# Patient Record
Sex: Male | Born: 1965 | Race: White | Hispanic: No | Marital: Married | Smoking: Never smoker
Health system: Southern US, Community
[De-identification: ages and names within clinical notes are randomized; demographics above are authoritative.]

## PROBLEM LIST (undated history)

## (undated) DIAGNOSIS — J189 Pneumonia, unspecified organism: Secondary | ICD-10-CM

## (undated) DIAGNOSIS — M109 Gout, unspecified: Secondary | ICD-10-CM

## (undated) DIAGNOSIS — E291 Testicular hypofunction: Secondary | ICD-10-CM

## (undated) DIAGNOSIS — E7800 Pure hypercholesterolemia, unspecified: Secondary | ICD-10-CM

## (undated) DIAGNOSIS — F32A Depression, unspecified: Secondary | ICD-10-CM

## (undated) DIAGNOSIS — R0602 Shortness of breath: Secondary | ICD-10-CM

## (undated) DIAGNOSIS — R002 Palpitations: Secondary | ICD-10-CM

## (undated) DIAGNOSIS — F329 Major depressive disorder, single episode, unspecified: Secondary | ICD-10-CM

## (undated) DIAGNOSIS — K219 Gastro-esophageal reflux disease without esophagitis: Secondary | ICD-10-CM

## (undated) DIAGNOSIS — I2699 Other pulmonary embolism without acute cor pulmonale: Secondary | ICD-10-CM

## (undated) DIAGNOSIS — E559 Vitamin D deficiency, unspecified: Secondary | ICD-10-CM

## (undated) HISTORY — DX: Depression, unspecified: F32.A

## (undated) HISTORY — DX: Gout, unspecified: M10.9

## (undated) HISTORY — DX: Palpitations: R00.2

## (undated) HISTORY — DX: Pure hypercholesterolemia, unspecified: E78.00

## (undated) HISTORY — PX: TONSILLECTOMY AND ADENOIDECTOMY: SUR1326

## (undated) HISTORY — DX: Gastro-esophageal reflux disease without esophagitis: K21.9

## (undated) HISTORY — DX: Testicular hypofunction: E29.1

## (undated) HISTORY — DX: Vitamin D deficiency, unspecified: E55.9

## (undated) HISTORY — DX: Pneumonia, unspecified organism: J18.9

## (undated) HISTORY — PX: EYE SURGERY: SHX253

## (undated) HISTORY — DX: Other pulmonary embolism without acute cor pulmonale: I26.99

## (undated) HISTORY — DX: Major depressive disorder, single episode, unspecified: F32.9

## (undated) HISTORY — PX: VASECTOMY: SHX75

## (undated) HISTORY — DX: Shortness of breath: R06.02

---

## 1998-04-04 ENCOUNTER — Other Ambulatory Visit: Admission: RE | Admit: 1998-04-04 | Discharge: 1998-04-04 | Payer: Self-pay | Admitting: *Deleted

## 2003-06-03 ENCOUNTER — Ambulatory Visit (HOSPITAL_COMMUNITY): Admission: RE | Admit: 2003-06-03 | Discharge: 2003-06-03 | Payer: Self-pay | Admitting: Internal Medicine

## 2003-06-03 ENCOUNTER — Encounter: Payer: Self-pay | Admitting: Internal Medicine

## 2010-03-24 ENCOUNTER — Encounter: Payer: Self-pay | Admitting: Nurse Practitioner

## 2011-02-24 ENCOUNTER — Ambulatory Visit
Admission: RE | Admit: 2011-02-24 | Discharge: 2011-02-24 | Disposition: A | Discharge status: Home / Self Care | Payer: BC Managed Care – PPO | Source: Ambulatory Visit | Attending: Internal Medicine | Admitting: Internal Medicine

## 2011-02-24 ENCOUNTER — Emergency Department (HOSPITAL_COMMUNITY)
Admission: EM | Admit: 2011-02-24 | Discharge: 2011-02-25 | Disposition: A | Discharge status: Home / Self Care | Payer: BC Managed Care – PPO | Attending: Emergency Medicine | Admitting: Emergency Medicine

## 2011-02-24 ENCOUNTER — Other Ambulatory Visit: Payer: Self-pay | Admitting: Internal Medicine

## 2011-02-24 DIAGNOSIS — J189 Pneumonia, unspecified organism: Secondary | ICD-10-CM | POA: Insufficient documentation

## 2011-02-24 DIAGNOSIS — M549 Dorsalgia, unspecified: Secondary | ICD-10-CM | POA: Insufficient documentation

## 2011-02-24 DIAGNOSIS — R0781 Pleurodynia: Secondary | ICD-10-CM

## 2011-02-24 DIAGNOSIS — R0602 Shortness of breath: Secondary | ICD-10-CM | POA: Insufficient documentation

## 2011-02-24 DIAGNOSIS — R509 Fever, unspecified: Secondary | ICD-10-CM

## 2011-02-24 DIAGNOSIS — R109 Unspecified abdominal pain: Secondary | ICD-10-CM

## 2011-02-24 DIAGNOSIS — IMO0001 Reserved for inherently not codable concepts without codable children: Secondary | ICD-10-CM

## 2011-02-24 DIAGNOSIS — R079 Chest pain, unspecified: Secondary | ICD-10-CM | POA: Insufficient documentation

## 2011-02-24 DIAGNOSIS — Z79899 Other long term (current) drug therapy: Secondary | ICD-10-CM | POA: Insufficient documentation

## 2011-02-24 DIAGNOSIS — F3289 Other specified depressive episodes: Secondary | ICD-10-CM | POA: Insufficient documentation

## 2011-02-24 DIAGNOSIS — E785 Hyperlipidemia, unspecified: Secondary | ICD-10-CM | POA: Insufficient documentation

## 2011-02-24 DIAGNOSIS — K219 Gastro-esophageal reflux disease without esophagitis: Secondary | ICD-10-CM | POA: Insufficient documentation

## 2011-02-24 DIAGNOSIS — F329 Major depressive disorder, single episode, unspecified: Secondary | ICD-10-CM | POA: Insufficient documentation

## 2011-02-24 MED ORDER — IOHEXOL 300 MG/ML  SOLN
100.0000 mL | Freq: Once | INTRAMUSCULAR | Status: AC | PRN
  Administered 2011-02-24: 100 mL via INTRAVENOUS

## 2011-02-25 ENCOUNTER — Emergency Department (HOSPITAL_COMMUNITY): Payer: BC Managed Care – PPO

## 2011-02-25 LAB — DIFFERENTIAL
Basophils Absolute: 0 10*3/uL (ref 0.0–0.1)
Eosinophils Absolute: 0 10*3/uL (ref 0.0–0.7)
Eosinophils Relative: 0 % (ref 0–5)
Lymphocytes Relative: 12 % (ref 12–46)
Lymphs Abs: 1.5 10*3/uL (ref 0.7–4.0)
Monocytes Absolute: 1.4 10*3/uL — ABNORMAL HIGH (ref 0.1–1.0)

## 2011-02-25 LAB — URINALYSIS, ROUTINE W REFLEX MICROSCOPIC
Bilirubin Urine: NEGATIVE
Ketones, ur: NEGATIVE mg/dL
Nitrite: NEGATIVE
Protein, ur: NEGATIVE mg/dL
pH: 6 (ref 5.0–8.0)

## 2011-02-25 LAB — BASIC METABOLIC PANEL
BUN: 10 mg/dL (ref 6–23)
Calcium: 8.7 mg/dL (ref 8.4–10.5)
GFR calc non Af Amer: >60 mL/min (ref >60)
Glucose, Bld: 140 mg/dL — ABNORMAL HIGH (ref 70–99)

## 2011-02-25 LAB — CBC
HCT: 39.2 % (ref 39.0–52.0)
MCHC: 35.5 g/dL (ref 30.0–36.0)
MCV: 86.5 fL (ref 78.0–100.0)
Platelets: 244 10*3/uL (ref 150–400)
RDW: 13.8 % (ref 11.5–15.5)

## 2011-02-26 ENCOUNTER — Encounter: Payer: Self-pay | Admitting: Internal Medicine

## 2011-02-26 ENCOUNTER — Other Ambulatory Visit (INDEPENDENT_AMBULATORY_CARE_PROVIDER_SITE_OTHER): Payer: BC Managed Care – PPO

## 2011-02-26 ENCOUNTER — Ambulatory Visit (INDEPENDENT_AMBULATORY_CARE_PROVIDER_SITE_OTHER): Payer: BC Managed Care – PPO | Admitting: Internal Medicine

## 2011-02-26 ENCOUNTER — Ambulatory Visit (INDEPENDENT_AMBULATORY_CARE_PROVIDER_SITE_OTHER)
Admission: RE | Admit: 2011-02-26 | Discharge: 2011-02-26 | Disposition: A | Discharge status: Home / Self Care | Payer: BC Managed Care – PPO | Source: Ambulatory Visit

## 2011-02-26 ENCOUNTER — Inpatient Hospital Stay (HOSPITAL_COMMUNITY)
Admission: EM | Admit: 2011-02-26 | Discharge: 2011-03-02 | DRG: 078 | Disposition: A | Discharge status: Home / Self Care | Payer: BC Managed Care – PPO | Attending: Emergency Medicine | Admitting: Emergency Medicine

## 2011-02-26 ENCOUNTER — Inpatient Hospital Stay (HOSPITAL_COMMUNITY): Payer: BC Managed Care – PPO

## 2011-02-26 VITALS — BP 120/82 | HR 96 | Temp 98.6°F | Ht 72.0 in | Wt 254.4 lb

## 2011-02-26 DIAGNOSIS — R071 Chest pain on breathing: Secondary | ICD-10-CM

## 2011-02-26 DIAGNOSIS — R0781 Pleurodynia: Secondary | ICD-10-CM

## 2011-02-26 DIAGNOSIS — E7800 Pure hypercholesterolemia, unspecified: Secondary | ICD-10-CM | POA: Diagnosis present

## 2011-02-26 DIAGNOSIS — F3289 Other specified depressive episodes: Secondary | ICD-10-CM | POA: Diagnosis present

## 2011-02-26 DIAGNOSIS — F329 Major depressive disorder, single episode, unspecified: Secondary | ICD-10-CM | POA: Diagnosis present

## 2011-02-26 DIAGNOSIS — K219 Gastro-esophageal reflux disease without esophagitis: Secondary | ICD-10-CM | POA: Diagnosis present

## 2011-02-26 DIAGNOSIS — I2699 Other pulmonary embolism without acute cor pulmonale: Principal | ICD-10-CM | POA: Diagnosis present

## 2011-02-26 LAB — CBC WITH DIFFERENTIAL/PLATELET
Basophils Relative: 0.6 % (ref 0.0–3.0)
Eosinophils Relative: 0.6 % (ref 0.0–5.0)
HCT: 41.2 % (ref 39.0–52.0)
Hemoglobin: 14.4 g/dL (ref 13.0–17.0)
Lymphs Abs: 1.7 10*3/uL (ref 0.7–4.0)
Monocytes Relative: 7.9 % (ref 3.0–12.0)
Neutro Abs: 9.4 10*3/uL — ABNORMAL HIGH (ref 1.4–7.7)
RBC: 4.51 Mil/uL (ref 4.22–5.81)
WBC: 12.2 10*3/uL — ABNORMAL HIGH (ref 4.5–10.5)

## 2011-02-26 LAB — BASIC METABOLIC PANEL
CO2: 28 mEq/L (ref 19–32)
Chloride: 102 mEq/L (ref 96–112)
Creatinine, Ser: 1.2 mg/dL (ref 0.4–1.5)

## 2011-02-26 LAB — SEDIMENTATION RATE: Sed Rate: 73 mm/hr — ABNORMAL HIGH (ref 0–22)

## 2011-02-26 LAB — CARDIAC PANEL(CRET KIN+CKTOT+MB+TROPI): CK, MB: 1.4 ng/mL (ref 0.3–4.0)

## 2011-02-26 LAB — URINE CULTURE

## 2011-02-26 MED ORDER — IOHEXOL 300 MG/ML  SOLN
100.0000 mL | Freq: Once | INTRAMUSCULAR | Status: AC | PRN
  Administered 2011-02-26: 100 mL via INTRAVENOUS

## 2011-02-26 NOTE — Progress Notes (Signed)
Subjective:     Patient ID: [name], male   DOB: October 06, 1965, 45 y.o.   MRN: [id number]  HPI  21 yowm never smoker and no previous respiratory dz referred to Pulmonary clinic by Dr Elisabeth Most 02/26/2011 for eval of cp   02/26/2011  Initial pulmonary office eval cc sudden onset back pain  p travel by car  to [facility] from [place] completed May 19th and onset pain June 4th felt like a kidney stone but hurt to lie on side intially R flank but began radiating up to R axilla rx with nsaids then 6/6 started levaquin mild chills,  No real cough.    Pt denies any significant sore throat, dysphagia, itching, sneezing,  nasal congestion or excess/ purulent secretions,  fever,  Shaking chills, sweats, unintended wt loss,  exertional cp, hempoptysis, orthopnea pnd or leg swelling.    Also denies any obvious fluctuation of symptoms with weather or environmental changes or other aggravating or alleviating factors.      PMHX  No h/o leg trauma No h/o dental problems No h/o unusual exposures  Review of Systems  Constitutional: Positive for appetite change. Negative for fever, chills, activity change and unexpected weight change.  HENT: Negative for congestion, sore throat, rhinorrhea, sneezing, trouble swallowing, dental problem, voice change and postnasal drip.   Eyes: Negative for visual disturbance.  Respiratory: Positive for shortness of breath. Negative for cough and choking.   Cardiovascular: Positive for chest pain. Negative for leg swelling.  Gastrointestinal: Negative for nausea, vomiting and abdominal pain.  Genitourinary: Negative for difficulty urinating.  Musculoskeletal: Negative for arthralgias.  Skin: Negative for rash.  Psychiatric/Behavioral: Negative for behavioral problems and confusion.       Objective:   Physical Exam    pleasant mod obese wm nad HEENT: nl dentition, turbinates, and orophanx. Nl external ear canals without cough reflex   NECK :  without  JVD/Nodes/TM/ nl carotid upstrokes bilaterally   LUNGS: no acc muscle use, clear to A and P bilaterally without cough on insp or exp maneuvers   CV:  RRR  no s3 or murmur or increase in P2, no edema   ABD:  soft and nontender with nl excursion in the supine position. No bruits or organomegaly, bowel sounds nl  MS:  warm without deformities, calf tenderness, cyanosis or clubbing  SKIN: warm and dry without lesions    NEURO:  alert, approp, no deficits   Assessment:         Plan:

## 2011-02-26 NOTE — Patient Instructions (Signed)
Please see patient coordinator before you leave today  to schedule CT Chest PE protocol  Increase levaquin to 750 mg daily until gone and take vicodin for pain if Scan  Is neg for PE

## 2011-02-26 NOTE — Assessment & Plan Note (Signed)
PE until proven otherwise > CT angiogram and admit to [facility] if Positive  Add:  POS BILAT PE > Admitted by [facility] service

## 2011-02-27 DIAGNOSIS — J9819 Other pulmonary collapse: Secondary | ICD-10-CM

## 2011-02-27 DIAGNOSIS — F329 Major depressive disorder, single episode, unspecified: Secondary | ICD-10-CM

## 2011-02-27 DIAGNOSIS — I2699 Other pulmonary embolism without acute cor pulmonale: Secondary | ICD-10-CM

## 2011-02-27 DIAGNOSIS — F3289 Other specified depressive episodes: Secondary | ICD-10-CM

## 2011-02-27 DIAGNOSIS — E782 Mixed hyperlipidemia: Secondary | ICD-10-CM

## 2011-02-27 LAB — COMPREHENSIVE METABOLIC PANEL
AST: 82 U/L — ABNORMAL HIGH (ref 0–37)
Albumin: 3.1 g/dL — ABNORMAL LOW (ref 3.5–5.2)
Calcium: 8.7 mg/dL (ref 8.4–10.5)
Chloride: 99 mEq/L (ref 96–112)
Creatinine, Ser: 0.99 mg/dL (ref 0.4–1.5)
Total Protein: 7 g/dL (ref 6.0–8.3)

## 2011-02-27 LAB — CBC
HCT: 37.5 % — ABNORMAL LOW (ref 39.0–52.0)
Hemoglobin: 12.8 g/dL — ABNORMAL LOW (ref 13.0–17.0)
MCV: 87.6 fL (ref 78.0–100.0)
Platelets: 270 10*3/uL (ref 150–400)
RBC: 4.21 MIL/uL — ABNORMAL LOW (ref 4.22–5.81)
RBC: 4.24 MIL/uL (ref 4.22–5.81)
WBC: 8.4 10*3/uL (ref 4.0–10.5)
WBC: 8.8 10*3/uL (ref 4.0–10.5)

## 2011-02-27 LAB — PROTIME-INR
INR: 1.09 (ref 0.00–1.49)
INR: 1.14 (ref 0.00–1.49)
Prothrombin Time: 14.8 seconds (ref 11.6–15.2)

## 2011-02-27 LAB — BASIC METABOLIC PANEL
BUN: 12 mg/dL (ref 6–23)
CO2: 24 mEq/L (ref 19–32)
Calcium: 8.7 mg/dL (ref 8.4–10.5)
Creatinine, Ser: 0.95 mg/dL (ref 0.4–1.5)
GFR calc non Af Amer: >60 mL/min (ref >60)
Glucose, Bld: 128 mg/dL — ABNORMAL HIGH (ref 70–99)
Sodium: 138 mEq/L (ref 135–145)

## 2011-02-27 LAB — LIPASE, BLOOD: Lipase: 26 U/L (ref 11–59)

## 2011-02-27 LAB — CARDIAC PANEL(CRET KIN+CKTOT+MB+TROPI)
CK, MB: 0.9 ng/mL (ref 0.3–4.0)
CK, MB: 0.8 ng/mL (ref 0.3–4.0)
Relative Index: 0.8 (ref 0.0–2.5)
Total CK: 117 U/L (ref 7–232)
Total CK: 89 U/L (ref 7–232)
Troponin I: <0.3 ng/mL (ref <0.30)

## 2011-02-27 LAB — HEPARIN LEVEL (UNFRACTIONATED)
Heparin Unfractionated: 0.24 IU/mL — ABNORMAL LOW (ref 0.30–0.70)
Heparin Unfractionated: 0.28 IU/mL — ABNORMAL LOW (ref 0.30–0.70)

## 2011-02-27 LAB — PHOSPHORUS: Phosphorus: 3.4 mg/dL (ref 2.3–4.6)

## 2011-02-27 LAB — APTT
aPTT: 57 seconds — ABNORMAL HIGH (ref 24–37)
aPTT: 64 seconds — ABNORMAL HIGH (ref 24–37)

## 2011-02-28 LAB — CBC
HCT: 36.4 % — ABNORMAL LOW (ref 39.0–52.0)
Platelets: 290 10*3/uL (ref 150–400)
RBC: 4.12 MIL/uL — ABNORMAL LOW (ref 4.22–5.81)
RDW: 13.6 % (ref 11.5–15.5)
WBC: 7.9 10*3/uL (ref 4.0–10.5)

## 2011-02-28 LAB — PROTIME-INR: INR: 1.14 (ref 0.00–1.49)

## 2011-03-01 LAB — CBC
HCT: 37.1 % — ABNORMAL LOW (ref 39.0–52.0)
Hemoglobin: 12.7 g/dL — ABNORMAL LOW (ref 13.0–17.0)
RBC: 4.25 MIL/uL (ref 4.22–5.81)

## 2011-03-01 LAB — HEPARIN LEVEL (UNFRACTIONATED): Heparin Unfractionated: 0.28 IU/mL — ABNORMAL LOW (ref 0.30–0.70)

## 2011-03-01 LAB — PROTIME-INR: Prothrombin Time: 14 seconds (ref 11.6–15.2)

## 2011-03-02 LAB — LUPUS ANTICOAGULANT PANEL
Lupus Anticoagulant: NOT DETECTED
dRVVT Incubated 1:1 Mix: 38.5 secs (ref 36.2–44.3)

## 2011-03-02 LAB — PROTIME-INR
INR: 1.11 (ref 0.00–1.49)
Prothrombin Time: 14.5 seconds (ref 11.6–15.2)

## 2011-03-02 LAB — PROTEIN C ACTIVITY: Protein C Activity: 93 % (ref 75–133)

## 2011-03-02 LAB — HOMOCYSTEINE: Homocysteine: 10.4 umol/L (ref 4.0–15.4)

## 2011-03-02 LAB — PROTEIN S ACTIVITY: Protein S Activity: 168 % — ABNORMAL HIGH (ref 69–129)

## 2011-03-02 LAB — CBC
Platelets: 326 10*3/uL (ref 150–400)
RBC: 4.36 MIL/uL (ref 4.22–5.81)
WBC: 7.8 10*3/uL (ref 4.0–10.5)

## 2011-03-02 LAB — PROTEIN C, TOTAL: Protein C, Total: 82 % (ref 72–160)

## 2011-03-03 ENCOUNTER — Telehealth: Payer: Self-pay | Admitting: Internal Medicine

## 2011-03-03 ENCOUNTER — Encounter: Payer: BC Managed Care – PPO | Admitting: *Deleted

## 2011-03-03 LAB — CARDIOLIPIN ANTIBODIES, IGG, IGM, IGA
Anticardiolipin IgA: 7 APL U/mL — ABNORMAL LOW (ref <22)
Anticardiolipin IgG: 8 GPL U/mL — ABNORMAL LOW (ref <23)

## 2011-03-03 LAB — BETA-2-GLYCOPROTEIN I ABS, IGG/M/A: Beta-2-Glycoprotein I IgA: 4 A Units (ref <20)

## 2011-03-03 NOTE — Telephone Encounter (Signed)
Called and spoke with pt.  Pt aware of RB's response.  Pt verbalized understanding and denied any questions and will let coumadin clinic know he is on levaquin.

## 2011-03-03 NOTE — Telephone Encounter (Signed)
The pharmacist is correct. The pt can start the levaquin, but he needs to call to inform his anticoagulation clinic so that they are aware that the new med is onboard. They may want to see him sooner for an INR check

## 2011-03-03 NOTE — Telephone Encounter (Signed)
Called and spoke with pt.  Pt states he went to fill his rx for Levaquin and was told by pharmacist to inform us this med interacts with his coumadin causing the coumadin "to work faster."  Pt states he has been on Levaquin before and didn't have any problems- just wanted to check with a doctor to make sure.  MW out of office this week.  Will forward message to doc of the day, RB, to address.

## 2011-03-04 ENCOUNTER — Ambulatory Visit (INDEPENDENT_AMBULATORY_CARE_PROVIDER_SITE_OTHER): Payer: BC Managed Care – PPO | Admitting: *Deleted

## 2011-03-04 DIAGNOSIS — I2699 Other pulmonary embolism without acute cor pulmonale: Secondary | ICD-10-CM | POA: Insufficient documentation

## 2011-03-04 DIAGNOSIS — I749 Embolism and thrombosis of unspecified artery: Secondary | ICD-10-CM

## 2011-03-04 LAB — POCT INR: INR: 2

## 2011-03-05 LAB — CULTURE, BLOOD (ROUTINE X 2)
Culture  Setup Time: 201206090203
Culture: NO GROWTH

## 2011-03-08 ENCOUNTER — Ambulatory Visit (INDEPENDENT_AMBULATORY_CARE_PROVIDER_SITE_OTHER): Payer: BC Managed Care – PPO | Admitting: *Deleted

## 2011-03-08 DIAGNOSIS — I749 Embolism and thrombosis of unspecified artery: Secondary | ICD-10-CM

## 2011-03-10 ENCOUNTER — Ambulatory Visit (INDEPENDENT_AMBULATORY_CARE_PROVIDER_SITE_OTHER): Payer: BC Managed Care – PPO | Admitting: Internal Medicine

## 2011-03-10 ENCOUNTER — Encounter: Payer: Self-pay | Admitting: Internal Medicine

## 2011-03-10 VITALS — BP 120/80 | HR 83 | Temp 98.3°F | Ht 72.0 in | Wt 261.0 lb

## 2011-03-10 DIAGNOSIS — I749 Embolism and thrombosis of unspecified artery: Secondary | ICD-10-CM

## 2011-03-10 NOTE — Assessment & Plan Note (Signed)
Calorie balance issues addressed

## 2011-03-10 NOTE — Assessment & Plan Note (Signed)
I had an extended discussion with the patient today lasting 15 to 20 minutes of a 25 minute visit on the following issues:   Dx and management reviewed. Only idenfiable risk factor = morbid obesity (see separate assessment)  See instructions for specific recommendations which were reviewed directly with the patient who was given a copy with highlighter outlining the key components.

## 2011-03-10 NOTE — Patient Instructions (Signed)
Stay on coumadin for 6 full months and avoid prolonged inactivity  - no restrictions on activity except prefer low impact aerobics   Your studies do not suggest hypercoagulability but you would benefit from wt loss down to low 200's   Weight control is simply a matter of calorie balance which needs to be tilted in your favor by eating less and exercising more.  To get the most out of exercise, you need to be continuously aware that you are short of breath, but never out of breath, for 30 minutes daily. As you improve, it will actually be easier for you to do the same amount of exercise  in  30 minutes so always push to the level where you are short of breath.  If this does not result in gradual weight reduction then I strongly recommend you see a nutritionist with a food diary x 2 weeks so that we can work out a negative calorie balance which is universally effective in steady weight loss programs.  Think of your calorie balance like you do your bank account where in this case you want the balance to go down so you must take in less calories than you burn up.  It's just that simple:  Hard to do, but easy to understand.  Good luck!   Please schedule a follow up visit in 3 months but call sooner if needed

## 2011-03-10 NOTE — Progress Notes (Signed)
Subjective:     Patient ID: [name], male   DOB: 08-11-1966, 45 y.o.   MRN: [id number]  HPI  44 yowm never smoker and no previous respiratory dz referred to Pulmonary clinic by Dr Elisabeth Most 02/26/2011 for eval of cp   02/26/2011  Initial pulmonary office eval cc sudden onset back pain  p travel by car  to [facility] from [place] completed May 19th and onset pain June 4th felt like a kidney stone but hurt to lie on side intially R flank but began radiating up to R axilla rx with nsaids then 6/6 started levaquin mild chills,  No real cough.  CT chest done and neg PE but not done in PE mode.    rec CT angiogram > pos pe/  Abn venous dopplers on Right but no active dvt > admit [facility] thru 03/02/11  03/10/2011 ov/ Maddax Palinkas  Post hosp  Minimal residual pleuritic discomfort no longer localized and minimal doe.Marland Kitchen Has chronic leg    PMHX  No h/o leg trauma No h/o dental problems No h/o unusual exposures       Objective:   Physical Exam    pleasant mod obese wm nad HEENT: nl dentition, turbinates, and orophanx. Nl external ear canals without cough reflex   NECK :  without JVD/Nodes/TM/ nl carotid upstrokes bilaterally   LUNGS: no acc muscle use, clear to A and P bilaterally without cough on insp or exp maneuvers   CV:  RRR  no s3 or murmur or increase in P2, no edema   ABD:  soft and nontender with nl excursion in the supine position. No bruits or organomegaly, bowel sounds nl  MS:  warm without deformities, calf tenderness, cyanosis or clubbing     Assessment:         Plan:

## 2011-03-11 ENCOUNTER — Encounter: Payer: BC Managed Care – PPO | Admitting: *Deleted

## 2011-03-15 ENCOUNTER — Encounter: Payer: BC Managed Care – PPO | Admitting: *Deleted

## 2011-03-16 ENCOUNTER — Ambulatory Visit (INDEPENDENT_AMBULATORY_CARE_PROVIDER_SITE_OTHER): Payer: BC Managed Care – PPO | Admitting: *Deleted

## 2011-03-16 DIAGNOSIS — I749 Embolism and thrombosis of unspecified artery: Secondary | ICD-10-CM

## 2011-03-16 LAB — POCT INR: INR: 2.3

## 2011-03-21 NOTE — Discharge Summary (Signed)
[name], [name]               ACCOUNT NO.:  [id number]  MEDICAL RECORD NO.:  [id number]  LOCATION:  3729                         FACILITY:  [facility]  PHYSICIAN:  Charlcie Cradle. [name], MD, FCCPDATE OF BIRTH:  07-Aug-1966  DATE OF ADMISSION:  02/26/2011 DATE OF DISCHARGE:  03/02/2011                              DISCHARGE SUMMARY   DISCHARGE DIAGNOSIS:  Acute pulmonary embolism.  HISTORY OF PRESENT ILLNESS:  See history and physical already on the chart.  In summary, this is a 45 year old male with previous history of hypercholesterolemia and depression who came to the hospital after being seen in a Pulmonary Office for 5 days of chest pain and dyspnea.  CT scan of chest was done, found to have bilateral lower lobe pulmonary emboli, was sent, performed CT scan at the emergency department for admission.  He had been on a cruise recently and had driven back from [place] for 12 hours in a sedentary position.  He has a relatively sedentary lifestyle and works from a desk as a Clinical research associate.  The patient is admitted for further inpatient care.  HOSPITAL COURSE:  The patient was thus admitted on to the Pulmonary Service and placed on IV heparin.  Lab data was obtained again showing a white count of 8400, hemoglobin 12.5, platelet count 270,000.  Initial PT/PTT were normal.  The patient did have a hypercoagulable assay performed during this hospitalization.  Protein S level was 147, total and functional 168, both of which are normal.  Protein C was functional was at 93.  Lupus anticoagulant was negative.  Protein C total was 82, which was normal.  Sodium 135, potassium 3.6, chloride 99, CO2 of 26, BUN 11, creatinine 0.9, blood sugar was 122.  Liver function is unremarkable.  Cardiac enzymes were unremarkable.  Cardiolipin antibody was normal.  Blood cultures were obtained with no growth.  Beta-2 glycoprotein antibodies were unremarkable.  Prothrombin gene mutation was negative.  Factor V Leiden  was negative.  HOSPITAL COURSE:  The patient continued to do well and was transitioned to Lovenox; however, his INR still remained normal at 1.1 on the date of discharge on March 02, 2011.  The patient was set up for the Coumadin clinic and was given a dose of 10 mg Coumadin on March 01, 2011, and was to give another 10 mg on March 02, 2011.  The Lovenox dosing was at 120 mg b.i.d. for an additional 7 days.  DISCHARGE MEDICATIONS:  Lovenox 100 mg subcu b.i.d. for additional 7 days.  Coumadin dosing at 10 mg daily to be adjusted further by the Coumadin Clinic for which the patient will be seen in 2 days.  The patient will be maintained on the prior medications as prescribed including the omeprazole 20 mg daily.  Celexa will be maintained at 20 mg daily.  Hydrocodone APAP p.r.n.  He will finish additional 3 days of Levaquin 5 mg daily and Crestor will be continued at 20 mg daily.  Depakote at 250 mg daily.  The patient has a return visit with Dr. Sherene Sires in 1 week's time and will be seen in the Coumadin Clinic in 2 days' time.  The patient is discharged in improved  condition and is ambulatory on room air with no outstanding vital sign abnormalities.     Charlcie Cradle [name], MD, [facility]     PEW/MEDQ  D:  03/13/2011  T:  03/14/2011  Job:  [id number]  cc:   Charlaine Dalton. Sherene Sires, MD, [facility]  Electronically Signed by Shan Levans MD FCCP on 03/21/2011 06:54:40 PM

## 2011-03-22 NOTE — H&P (Signed)
[name], [name] NO.:  [id number]  MEDICAL RECORD NO.:  [id number]  LOCATION:  3729                         FACILITY:  [facility]  PHYSICIAN:  Felipa Evener, MD  DATE OF BIRTH:  1966-04-30  DATE OF ADMISSION:  02/26/2011 DATE OF DISCHARGE:                             HISTORY & PHYSICAL   IDENTIFICATION:  The patient is a 45 year old male with past medical significant for hypercholesteremia and depression, who presented to the hospital after being seen in Pulmonary office for 5 days of chest pains and shortness of breath.  CT scan was done in the office and the patient was found to have pulmonary emboli.  The patient was sent to the emergency department for admission.  Evidently, the patient has been on the cruise that he flew from [place] drove for 12 hours.  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  Approximately two and a half weeks after coming back from the cruise, the patient started exercising on a bike and was noted to develop shortness of breath shortly, thereafter, he did report some swelling in the right leg, but none on the left, with some fever, no chills.  No nausea, no vomiting, no abdominal pain.  Did report bilateral lower lung field chest wall pain with radiating to the back.  The pain is sharp in nature, but not tearing.  He does report some dry cough, but no significant sputum production, no hemoptysis.  No weight gain, no weight loss.  PAST MEDICAL HISTORY:  Significant for depression for which he takes Celexa and Depakote.  GERD for which he takes omeprazole. Hypercholesteremia for which he takes Crestor and was recently started on levofloxacin yesterday for question of pneumonia on chest x-ray and has been taking Percocet for the chest pain as reported above.  ALLERGIES:  NO KNOWN DRUG ALLERGIES.  FAMILY HISTORY:  Positive for hypertension and  heart disease as well as father with melanoma and prostate cancer.  SOCIAL HISTORY:  He is a lifelong nonsmoker, drinks approximately 5 drinks per week.  No drug abuse.  Works as a Clinical research associate and does not exercise regularly.  REVIEW OF SYSTEMS:  Twelve-point review of systems was negative other than mentioned above.  PHYSICAL EXAMINATION:  GENERAL:  This is a well-appearing male, resting comfortably on exam bed, in no acute distress. VITAL SIGNS:  He is afebrile.  Temperature 98.2, heart rate was 97, respiratory rate of 14, blood pressure 107/65 and saturation 97% room air. HEENT:  Normocephalic, atraumatic.  Pupils are equal, round and reactive to light.  Extraocular movements are intact.  Oral and nasal mucosa within normal limits. NECK:  No thyromegaly.  No lymphadenopathy.  No jugular reflux appreciated. HEART:  Regular rate and rhythm.  S1 and S2 normal.  No murmurs, rubs or gallops appreciated. LUNGS:  Clear to auscultation bilaterally. ABDOMEN:  Soft, nontender, nondistended.  Positive bowel sounds. EXTREMITIES:  With 1+ edema on the right, no edema on the left.  No Homans sign and no cord sign appreciated on examination of the lower extremity.  LABORATORY STUDIES:  Were reviewed  significant for chest x-ray reviewed myself that showed a question of right lower lobe infiltrate.  A CT that showed bilateral pulmonary PEs on the lower side with basal atelectasis and small right effusion.  No evidence of infiltrate.  BMP, sodium 137, potassium 4.3, chloride 102, CO2 of 28, glucose of 102, BUN was 10, creatinine 1.2, calcium of 8.9.  His ESR is 73.  CBC:  White blood count was 12.2, hemoglobin 14.4, hematocrit 41.2, platelet count of 303. Urine culture done yesterday was negative.  Lactic acid done yesterday was 1.3.  Urinalysis on June 7th was also negative.  ASSESSMENT/PLAN:  The patient is a 45 year old male with past medical significant for hypercholesteremia and depression,  who presents with a provoked pulmonary embolism, currently the patient is showing no signs of distress, pain seemed to be under control with Percocet.  He is saturating 97% on room air, was not tachycardic, with normal blood pressure, therefore, admit the patient to telemetry, place the patient on heparin drip.  We will continue course of levofloxacin since the patient has had 3 days of that already, for a total of 8 days.  Celexa and Depakote will be continued for depression.  Percocet will be used for p.r.n. for pain and fever.  Protonix 40 mg daily for GI prophylaxis with treatment of the patient's gastroesophageal reflux disease. Heparin drip IV per pharmacy will be started.  We will order bilateral lower extremity Doppler to evaluate the clot burden, which I doubt would be high, 2-D echo to evaluate the ejection fraction and pulmonary pressure as well will be ordered.     Felipa Evener, MD     WJY/MEDQ  D:  02/26/2011  T:  02/27/2011  Job:  [id number]  Electronically Signed by Koren Bound MD on 03/22/2011 04:09:13 PM

## 2011-03-30 ENCOUNTER — Ambulatory Visit (INDEPENDENT_AMBULATORY_CARE_PROVIDER_SITE_OTHER): Payer: BC Managed Care – PPO | Admitting: *Deleted

## 2011-03-30 DIAGNOSIS — I749 Embolism and thrombosis of unspecified artery: Secondary | ICD-10-CM

## 2011-04-27 ENCOUNTER — Ambulatory Visit (INDEPENDENT_AMBULATORY_CARE_PROVIDER_SITE_OTHER): Payer: BC Managed Care – PPO | Admitting: *Deleted

## 2011-04-27 DIAGNOSIS — I749 Embolism and thrombosis of unspecified artery: Secondary | ICD-10-CM

## 2011-05-13 ENCOUNTER — Telehealth: Payer: Self-pay | Admitting: Pharmacist

## 2011-05-13 NOTE — Telephone Encounter (Signed)
Called spoke with pt, pt states for the past 2-3 days his feet have been sore and stiff bilaterally.  Look a little swollen, and wake him up at night, pt states he can only describe it as his feet don't feel right. Pt wanted to know if this could be a side effect of Coumadin, advised pt this is not a typical side effect of Coumadin.  Advised pt to see his primary care MD for evaluation to have assessed. Pt states he will monitor and seek evaluation if persists.

## 2011-05-25 ENCOUNTER — Encounter: Payer: BC Managed Care – PPO | Admitting: *Deleted

## 2011-06-01 ENCOUNTER — Ambulatory Visit (INDEPENDENT_AMBULATORY_CARE_PROVIDER_SITE_OTHER): Payer: BC Managed Care – PPO | Admitting: *Deleted

## 2011-06-01 DIAGNOSIS — I749 Embolism and thrombosis of unspecified artery: Secondary | ICD-10-CM

## 2011-06-15 ENCOUNTER — Ambulatory Visit (INDEPENDENT_AMBULATORY_CARE_PROVIDER_SITE_OTHER): Payer: BC Managed Care – PPO | Admitting: *Deleted

## 2011-06-15 DIAGNOSIS — I749 Embolism and thrombosis of unspecified artery: Secondary | ICD-10-CM

## 2011-06-15 LAB — POCT INR: INR: 2.1

## 2011-06-24 ENCOUNTER — Other Ambulatory Visit (INDEPENDENT_AMBULATORY_CARE_PROVIDER_SITE_OTHER): Payer: BC Managed Care – PPO

## 2011-06-24 ENCOUNTER — Encounter: Payer: Self-pay | Admitting: Internal Medicine

## 2011-06-24 ENCOUNTER — Ambulatory Visit (INDEPENDENT_AMBULATORY_CARE_PROVIDER_SITE_OTHER): Payer: BC Managed Care – PPO | Admitting: Internal Medicine

## 2011-06-24 ENCOUNTER — Ambulatory Visit (INDEPENDENT_AMBULATORY_CARE_PROVIDER_SITE_OTHER)
Admission: RE | Admit: 2011-06-24 | Discharge: 2011-06-24 | Disposition: A | Discharge status: Home / Self Care | Payer: BC Managed Care – PPO | Source: Ambulatory Visit | Attending: Internal Medicine | Admitting: Internal Medicine

## 2011-06-24 VITALS — BP 119/92 | HR 84 | Temp 98.2°F | Ht 72.0 in | Wt 263.2 lb

## 2011-06-24 DIAGNOSIS — I2699 Other pulmonary embolism without acute cor pulmonale: Secondary | ICD-10-CM

## 2011-06-24 DIAGNOSIS — I10 Essential (primary) hypertension: Secondary | ICD-10-CM

## 2011-06-24 LAB — TSH: TSH: 1.46 u[IU]/mL (ref 0.35–5.50)

## 2011-06-24 NOTE — Assessment & Plan Note (Signed)
rec complete 6 months rx then change to ASA 325 mg daily and work on wt

## 2011-06-24 NOTE — Progress Notes (Signed)
Subjective:     Patient ID: [name], male   DOB: [birth year], 45 y.o.   MRN: [id number]  HPI  81 yowm never smoker and no previous respiratory dz referred to Pulmonary clinic by Dr Elisabeth Most 02/26/2011 for eval of cp   02/26/2011  Initial pulmonary office eval cc sudden onset back pain  p travel by car  to [facility] from [place] completed May 19th and onset pain June 4th felt like a kidney stone but hurt to lie on side intially R flank but began radiating up to R axilla rx with nsaids then 6/6 started levaquin mild chills,  No real cough.  CT chest done and neg PE but not done in PE mode.    rec CT angiogram > pos pe/  Abn venous dopplers on Right but no active dvt > admit [facility] thru 03/02/11  03/10/2011 ov/ [name]  Post hosp  Minimal residual pleuritic discomfort no longer localized and minimal doe.Marland Kitchen Has chronic leg  rec Stay on coumadin for 6 full months and avoid prolonged inactivity  - no restrictions on activity except prefer low impact aerobics   Your studies do not suggest hypercoagulability but you would benefit from wt loss down to low 200's   Weight control discussed in context of calorie balance  06/24/2011 f/u ov/[name] cc much better, no sob (though not exercising) no cough or cp, no leg swelling  ROS  At present neg for  any significant sore throat, dysphagia, itching, sneezing,  nasal congestion or excess/ purulent secretions,  fever, chills, sweats, unintended wt loss, pleuritic or exertional cp, hempoptysis, orthopnea pnd.   Also denies presyncope, palpitations, heartburn, abdominal pain, nausea, vomiting, diarrhea  or change in bowel or urinary habits, dysuria,hematuria,  rash, arthralgias, visual complaints, headache, numbness weakness or ataxia.      PMHX  No h/o leg trauma No h/o dental problems No h/o unusual exposures       Objective:   Physical Exam    pleasant mod obese wm nad - note bp 120/90 repeated by me    Wt 254 > Wt 06/24/2011   263    HEENT: nl dentition, turbinates, and orophanx. Nl external ear canals without cough reflex   NECK :  without JVD/Nodes/TM/ nl carotid upstrokes bilaterally   LUNGS: no acc muscle use, clear to A and P bilaterally without cough on insp or exp maneuvers   CV:  RRR  no s3 or murmur or increase in P2, no edema   ABD:  soft and nontender with nl excursion in the supine position. No bruits or organomegaly, bowel sounds nl  MS:  warm without deformities, calf tenderness, cyanosis or clubbing  CXR  06/24/2011 : Comparison: 02/26/2011  Findings: Much improved lung volumes and aeration. No confluent opacities currently. Heart is normal size. No effusions or acute bony abnormality.  IMPRESSION: No active disease.       Assessment:         Plan:

## 2011-06-24 NOTE — Patient Instructions (Addendum)
I recommend you complete 6 total months of coumadin  Be very careful with salt and have your blood pressure rechecked at your next coumadin clinic visit in 2 weeks  Less than 221 is your target weight.  We will call you with your cxr and tsh results  Weight control is simply a matter of calorie balance which needs to be tilted in your favor by eating less and exercising more.  To get the most out of exercise, you need to be continuously aware that you are short of breath, but never out of breath, for 30 minutes daily. As you improve, it will actually be easier for you to do the same amount of exercise  in  30 minutes so always push to the level where you are short of breath.  If this does not result in gradual weight reduction then I strongly recommend you see a nutritionist with a food diary x 2 weeks so that we can work out a negative calorie balance which is universally effective in steady weight loss programs.  Think of your calorie balance like you do your bank account where in this case you want the balance to go down so you must take in less calories than you burn up.  It's just that simple:  Hard to do, but easy to understand.  Good luck!    If you are satisfied with your treatment plan let your doctor know and he/she can either refill your medications or you can return here when your prescription runs out.     If in any way you are not 100% satisfied,  please tell us.  If 100% better, tell your friends!

## 2011-06-24 NOTE — Assessment & Plan Note (Signed)
Cal balance issues addressed

## 2011-06-25 ENCOUNTER — Telehealth: Payer: Self-pay | Admitting: Internal Medicine

## 2011-06-28 NOTE — Progress Notes (Signed)
Quick Note:  I informed pt of MW's findings and recommendations. Pt verbalized understanding    ______ 

## 2011-06-28 NOTE — Telephone Encounter (Signed)
Informed pt of both CXR and lab results per MW. Julaine Hua, CMA

## 2011-06-28 NOTE — Progress Notes (Signed)
Quick Note:  I informed pt of RA's findings and recommendations. Pt verbalized understanding. Dell Hurtubise W. Peng Thorstenson, CMA  ______ 

## 2011-07-06 IMAGING — CT CT ANGIO CHEST
2 of 6 series · 19 of 36 positions shown · IV contrast (Omnipaque 300)
Comparison: 02/24/2011

CLINICAL DATA: Pleuritic right chest pain, shortness of breath,
recent 12 hour car trip, question pulmonary embolism

CT ANGIOGRAPHY CHEST WITH CONTRAST
TECHNIQUE: Multidetector CT imaging of the chest was performed
using the standard protocol during bolus administration of
intravenous contrast.  Multiplanar CT image reconstructions
including MIPs were obtained to evaluate the vascular anatomy.
Contrast:  100 ml Omnipaque 300 IV

[Series 5: thins (id) / (id) · axial · 0.75mm/px · z∈[-217,-7]mm · 18 of 234 slices shown]
[im 12/234  lung]
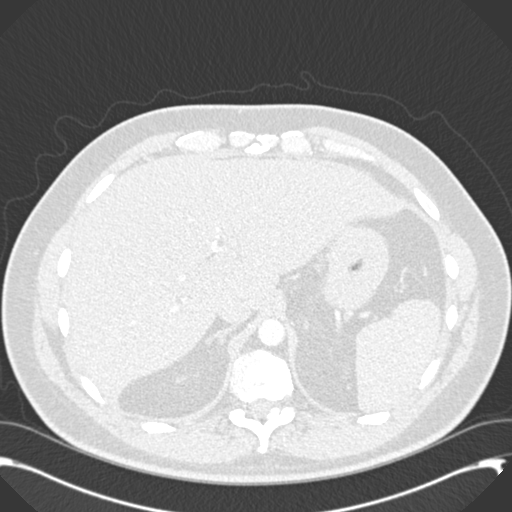
[im 24/234  mediastinal]
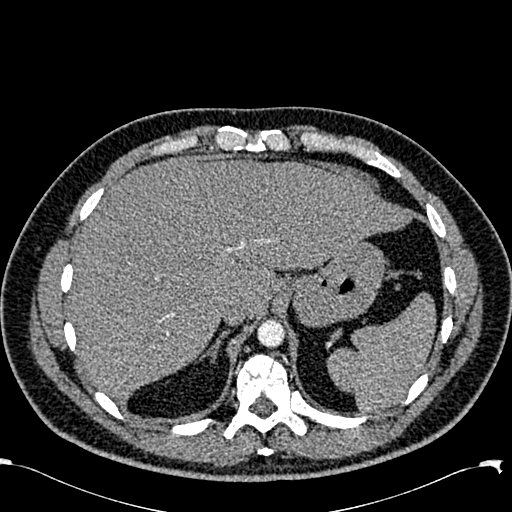
[im 35/234  lung]
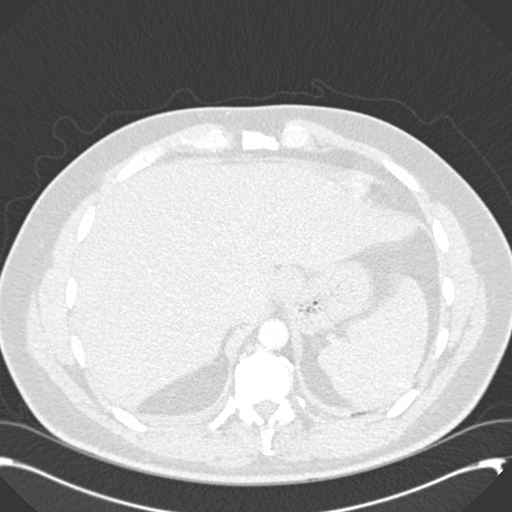
[im 47/234  mediastinal]
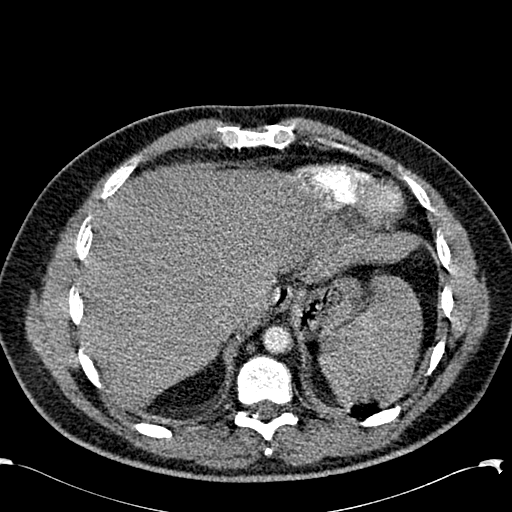
[im 59/234  lung]
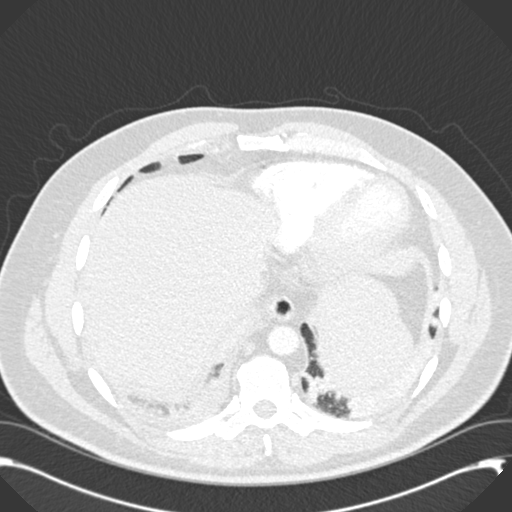
[im 70/234  mediastinal]
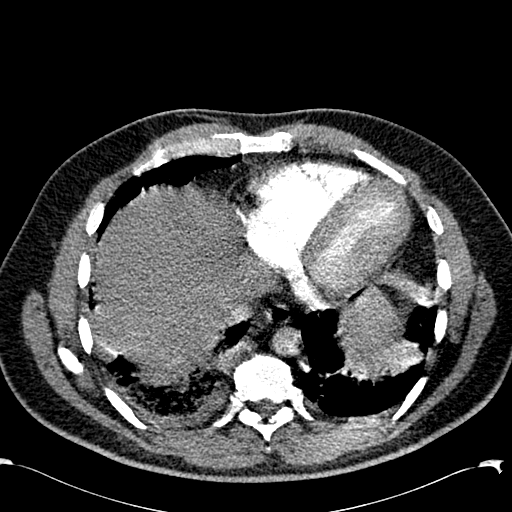
[im 82/234  lung]
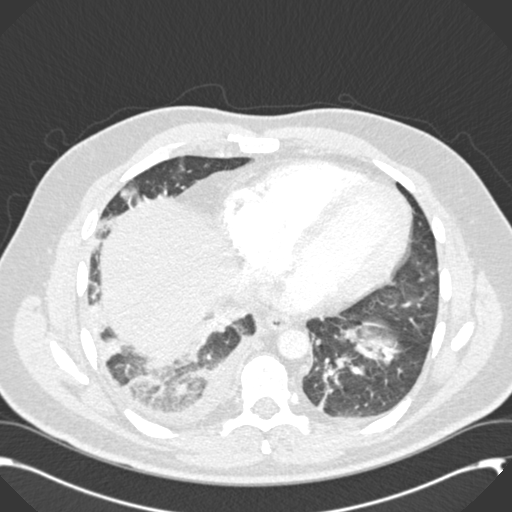
[im 94/234  mediastinal]
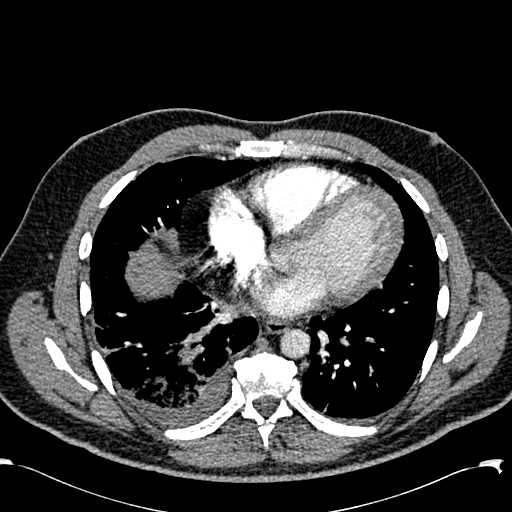
[im 105/234  lung]
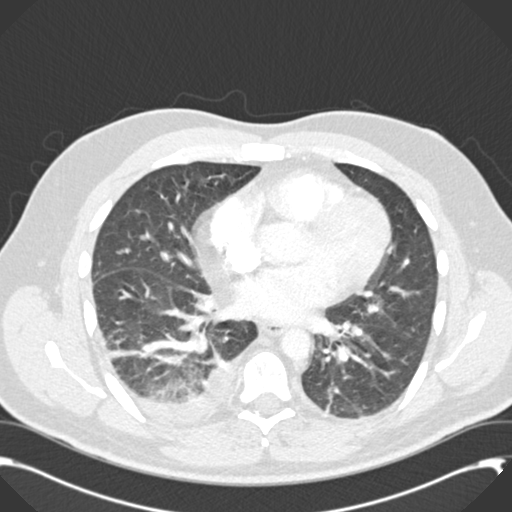
[im 129/234  mediastinal]
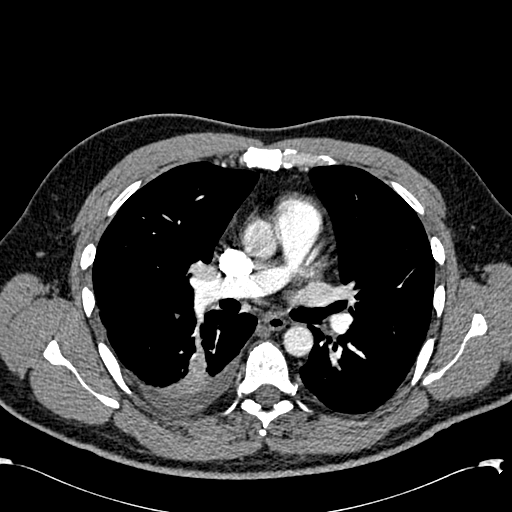
[im 140/234  lung]
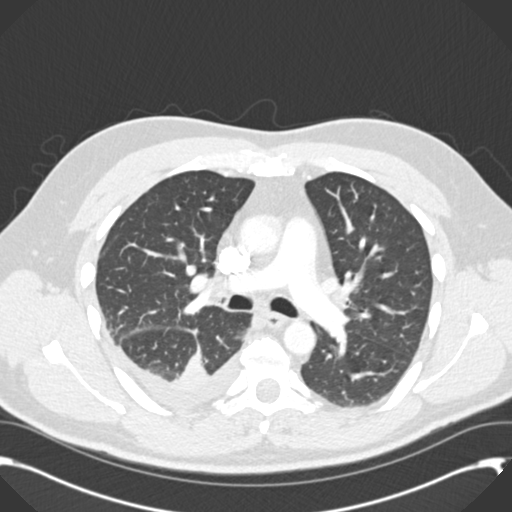
[im 152/234  mediastinal]
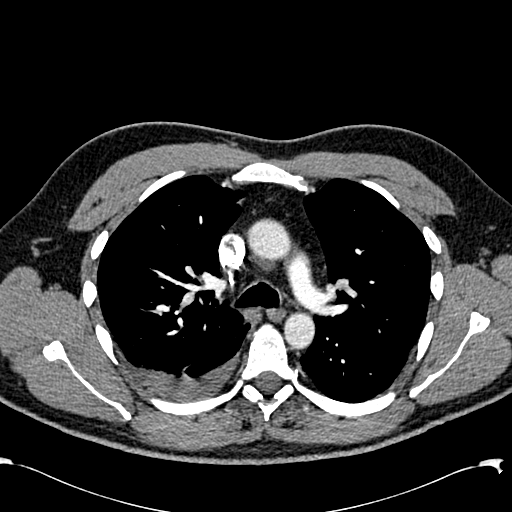
[im 164/234  lung]
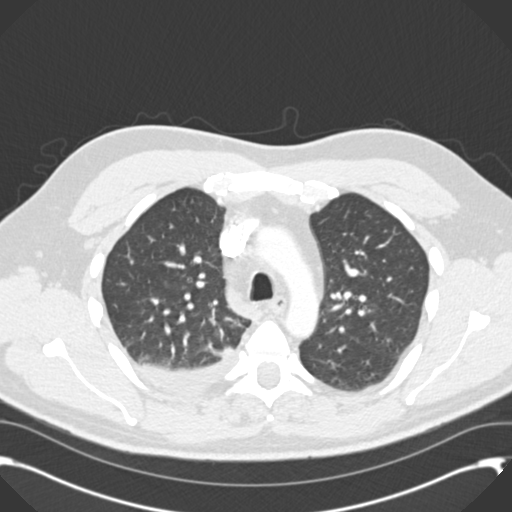
[im 175/234  mediastinal]
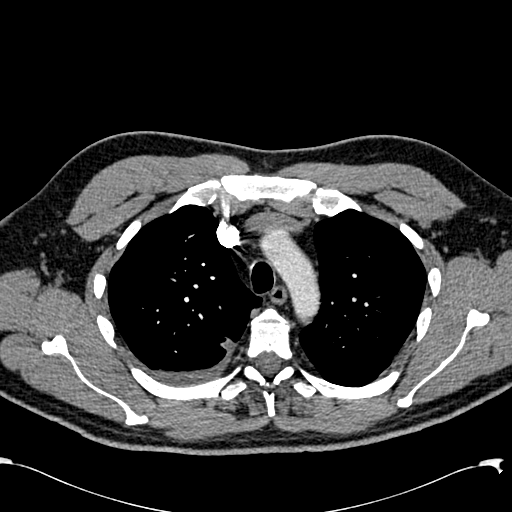
[im 187/234  lung]
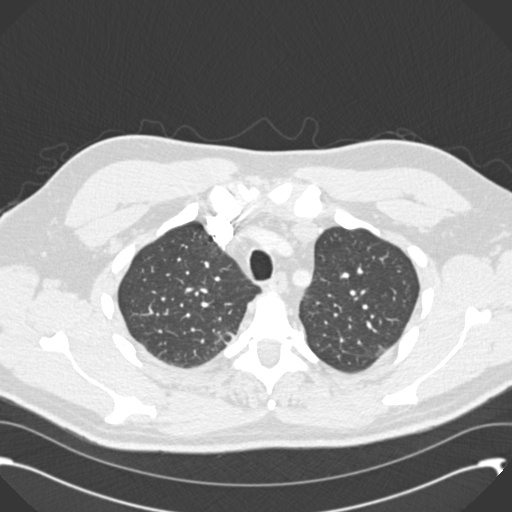
[im 199/234  mediastinal]
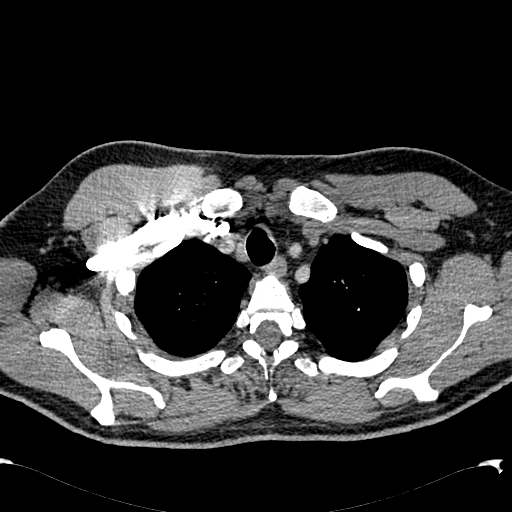
[im 210/234  lung]
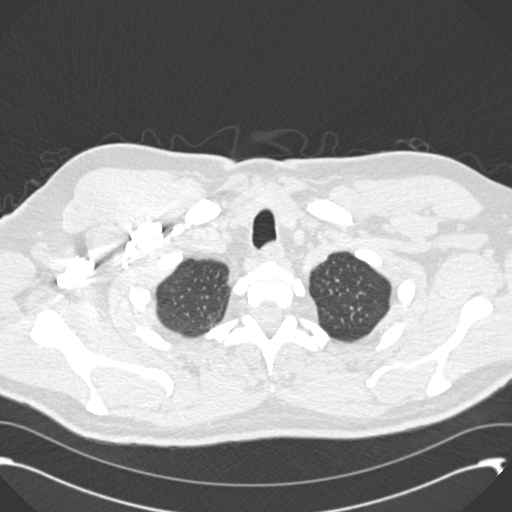
[im 222/234  mediastinal]
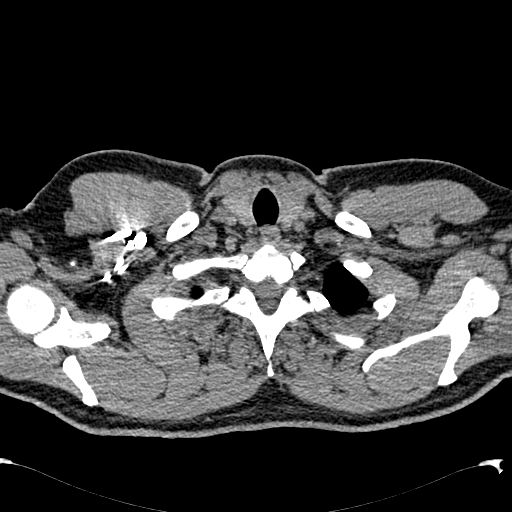

[Series 602: cor mpr · coronal · 0.75mm/px · 1 of 104 slices shown]
[im 52/104  mediastinal]
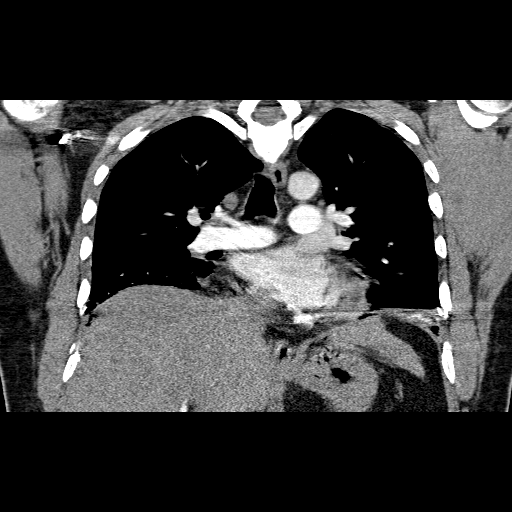

[19 of 36 positions shown; findings below may reference images not displayed]

FINDINGS: Aorta normal caliber without aneurysm or dissection.
Filling defects identified in bilateral lower lobe pulmonary
arteries compatible with pulmonary embolism.
Remaining pulmonary arteries appear patent.
Small right pleural effusion.
Subsegmental atelectasis bilateral lower lobes, greater on the
right.
Scattered respiratory motion artifacts degrade assessment.
No definite infiltrate or pneumothorax.
No acute osseous abnormalities.

Review of the MIP images confirms the above findings.
IMPRESSION: Pulmonary emboli are present in bilateral lower lobe pulmonary
arteries.
Bibasilar atelectasis and small right pleural effusion.

Critical test results telephoned to Steacy RN at Dr. [REDACTED]
at the time of interpretation on 02/26/2011 at 9990 hrs.

## 2011-07-07 ENCOUNTER — Ambulatory Visit (INDEPENDENT_AMBULATORY_CARE_PROVIDER_SITE_OTHER): Payer: BC Managed Care – PPO | Admitting: *Deleted

## 2011-07-07 DIAGNOSIS — I749 Embolism and thrombosis of unspecified artery: Secondary | ICD-10-CM

## 2011-07-07 DIAGNOSIS — I2699 Other pulmonary embolism without acute cor pulmonale: Secondary | ICD-10-CM

## 2011-07-07 LAB — POCT INR: INR: 2.2

## 2011-08-04 ENCOUNTER — Ambulatory Visit (INDEPENDENT_AMBULATORY_CARE_PROVIDER_SITE_OTHER): Payer: BC Managed Care – PPO | Admitting: *Deleted

## 2011-08-04 DIAGNOSIS — I2699 Other pulmonary embolism without acute cor pulmonale: Secondary | ICD-10-CM

## 2011-08-04 DIAGNOSIS — I749 Embolism and thrombosis of unspecified artery: Secondary | ICD-10-CM

## 2011-08-04 LAB — POCT INR: INR: 2.4

## 2011-09-28 ENCOUNTER — Encounter: Payer: Self-pay | Admitting: Cardiology

## 2011-10-15 ENCOUNTER — Institutional Professional Consult (permissible substitution): Payer: BC Managed Care – PPO | Admitting: Cardiology

## 2011-11-05 ENCOUNTER — Ambulatory Visit (INDEPENDENT_AMBULATORY_CARE_PROVIDER_SITE_OTHER): Payer: BC Managed Care – PPO | Admitting: Cardiology

## 2011-11-05 ENCOUNTER — Encounter: Payer: Self-pay | Admitting: Cardiology

## 2011-11-05 VITALS — BP 127/84 | HR 91 | Ht 72.0 in | Wt 264.0 lb

## 2011-11-05 DIAGNOSIS — E785 Hyperlipidemia, unspecified: Secondary | ICD-10-CM

## 2011-11-05 DIAGNOSIS — R002 Palpitations: Secondary | ICD-10-CM | POA: Insufficient documentation

## 2011-11-05 DIAGNOSIS — E782 Mixed hyperlipidemia: Secondary | ICD-10-CM | POA: Insufficient documentation

## 2011-11-05 DIAGNOSIS — R079 Chest pain, unspecified: Secondary | ICD-10-CM

## 2011-11-05 DIAGNOSIS — E7800 Pure hypercholesterolemia, unspecified: Secondary | ICD-10-CM

## 2011-11-05 NOTE — Progress Notes (Signed)
[name] Date of Birth: December 19, 1965 Medical Record #[id number]  History of Present Illness: [name] is seen at the request of Dr. Elisabeth Most for evaluation of palpitations and chest discomfort. 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 He complains that he has been experiencing symptoms of his heart racing. This typically occurs at night when he is lying down. It has been infrequent. Couple of months ago he was having more frequent symptoms but now it's only occurring once every 2 weeks. He does complain of a little bit of intermittent chest pain. It is hard for him to quantitate this. He does have a history of acute pulmonary embolus in June of 2012. He was treated with Coumadin for several months and then this was discontinued. He had an echocardiogram at that time which showed mild right ventricular dilatation. He had normal left ventricular function and normal valvular function. He is sedentary.  Current Outpatient Prescriptions on File Prior to Visit  Medication Sig Dispense Refill  . aspirin 325 MG tablet Take 325 mg by mouth daily.      Marland Kitchen atorvastatin (LIPITOR) 80 MG tablet Take 80 mg by mouth daily.      Marland Kitchen desvenlafaxine (PRISTIQ) 50 MG 24 hr tablet Take 50 mg by mouth daily.      . mirtazapine (REMERON) 30 MG tablet Take 30 mg by mouth at bedtime.      Marland Kitchen DISCONTD: omeprazole (PRILOSEC) 20 MG capsule Take 1 tablet by mouth daily.        No Known Allergies  Past Medical History  Diagnosis Date  . Community acquired pneumonia   . Hypercholesteremia   . Depression   . Shortness of breath   . Chest pain   . Pulmonary embolism     6/12  . GERD (gastroesophageal reflux disease)   . Palpitations     History reviewed. No pertinent past surgical history.  History  Smoking status  . Never Smoker   Smokeless tobacco  . Never Used    History  Alcohol Use  . 1.5 - 2.0 oz/week  . 3-4 drink(s) per week    Family History  Problem Relation Age of Onset  . Prostate cancer Father    . Melanoma Father   . Cancer Father     Prostate Cancer  . Emphysema Maternal Grandmother     was a smoker    Review of Systems: As noted in history of present illness.  All other systems were reviewed and are negative.  Physical Exam: BP 127/84  Pulse 91  Ht 6' (1.829 m)  Wt 264 lb (119.75 kg)  BMI 35.80 kg/m2 He is an overweight white male in no acute distress.The patient is alert and oriented x 3.  The mood and affect are normal.  The skin is warm and dry.  Color is normal.  The HEENT exam reveals that the sclera are nonicteric.  The mucous membranes are moist.  The carotids are 2+ without bruits.  There is no thyromegaly.  There is no JVD.  The lungs are clear.  The chest wall is non tender.  The heart exam reveals a regular rate with a normal S1 and S2.  There are no murmurs, gallops, or rubs.  The PMI is not displaced.   Abdominal exam reveals good bowel sounds.  There is no guarding or rebound.  There is no hepatosplenomegaly or tenderness.  There are no masses.  Exam of the legs reveal no clubbing, cyanosis, or edema.  The legs are  without rashes.  The distal pulses are intact.  Cranial nerves II - XII are intact.  Motor and sensory functions are intact.  The gait is normal.  LABORATORY DATA: ECG today is normal. Recent laboratory data on 09/28/2011 showed a normal CBC and chemistry panel. Cholesterol is 188, triglycerides 182, HDL 39, LDL 113. Magnesium and TSH were normal.  Assessment / Plan:

## 2011-11-05 NOTE — Assessment & Plan Note (Signed)
His palpitation symptoms seem to be improved. We discussed possibly wearing a monitor to further evaluate his symptoms but he wants to wait and see if they get better. I recommended avoidance of caffeine. If his stress echo is normal this would put him in a pretty low risk category and we could wait and see. Certainly if his symptoms worsen we could have him wear an event monitor.

## 2011-11-05 NOTE — Patient Instructions (Signed)
We will schedule you for an exercise stress test.  If this is normal I would recommend regular aerobic exercise and weight loss  If your stress test is normal we will monitor your palpitations for now. If they worsen we can have you wear a monitor.

## 2011-11-05 NOTE — Assessment & Plan Note (Signed)
He has atypical chest pain symptoms. He does have risk factors of hypercholesterolemia, hypertension, and sedentary lifestyle. He is interested in starting exercise. I recommended a stress echo to further stratify his cardiac risk.

## 2011-11-11 ENCOUNTER — Ambulatory Visit: Payer: Self-pay | Admitting: Cardiology

## 2011-11-11 DIAGNOSIS — I2699 Other pulmonary embolism without acute cor pulmonale: Secondary | ICD-10-CM

## 2011-11-15 ENCOUNTER — Ambulatory Visit (INDEPENDENT_AMBULATORY_CARE_PROVIDER_SITE_OTHER): Payer: BC Managed Care – PPO | Admitting: Nurse Practitioner

## 2011-11-15 ENCOUNTER — Ambulatory Visit (HOSPITAL_COMMUNITY): Payer: BC Managed Care – PPO | Attending: Cardiology

## 2011-11-15 ENCOUNTER — Other Ambulatory Visit: Payer: Self-pay | Admitting: Nurse Practitioner

## 2011-11-15 DIAGNOSIS — R0989 Other specified symptoms and signs involving the circulatory and respiratory systems: Secondary | ICD-10-CM

## 2011-11-15 DIAGNOSIS — R072 Precordial pain: Secondary | ICD-10-CM | POA: Insufficient documentation

## 2011-11-15 DIAGNOSIS — I10 Essential (primary) hypertension: Secondary | ICD-10-CM | POA: Insufficient documentation

## 2011-11-15 DIAGNOSIS — E785 Hyperlipidemia, unspecified: Secondary | ICD-10-CM | POA: Insufficient documentation

## 2011-11-15 DIAGNOSIS — R0609 Other forms of dyspnea: Secondary | ICD-10-CM | POA: Insufficient documentation

## 2011-11-15 DIAGNOSIS — R002 Palpitations: Secondary | ICD-10-CM | POA: Insufficient documentation

## 2011-11-15 DIAGNOSIS — R5381 Other malaise: Secondary | ICD-10-CM | POA: Insufficient documentation

## 2011-11-15 NOTE — Progress Notes (Deleted)
Exercise Treadmill Test  Pre-Exercise Testing Evaluation Rhythm: normal sinus  Rate: 73   PR:  .18 QRS:  .10  QT:  .40 QTc: .44     Test  Exercise Tolerance Test Ordering MD: Peter Swaziland MD  Interpreting MD:  Norma Fredrickson NP  Unique Test No: 1  Treadmill:  1  Indication for ETT: PAPLS  Contraindication to ETT: No   Stress Modality: exercise - treadmill  Cardiac Imaging Performed: non   Protocol: standard Bruce - maximal  Max BP:  ***/***  Max MPHR (bpm):  176 85% MPR (bpm):  149  MPHR obtained (bpm):  *** % MPHR obtained:  ***  Reached 85% MPHR (min:sec):  *** Total Exercise Time (min-sec):  ***  Workload in METS:  *** Borg Scale: ***  Reason ETT Terminated:  {CHL REASON TERMINATED FOR XBM:84132440}    ST Segment Analysis At Rest: {CHL ST SEGMENT AT REST FOR NUU:72536644} With Exercise: {CHL ST SEGMENT WITH EXERCISE FOR IHK:74259563}  Other Information Arrhythmia:  {CHL ARRHYTHMIA FOR OVF:64332951} Angina during ETT:  {CHL ANGINA DURING OAC:16606301} Quality of ETT:  {CHL QUALITY OF SWF:09323557}  ETT Interpretation:  {CHL INTERPRETATION FOR DUK:02542706}  Comments: ***  Recommendations: ***

## 2011-12-16 ENCOUNTER — Encounter: Payer: Self-pay | Admitting: Nurse Practitioner

## 2013-08-21 ENCOUNTER — Other Ambulatory Visit: Payer: Self-pay | Admitting: Emergency Medicine

## 2013-08-21 MED ORDER — ALLOPURINOL 300 MG PO TABS: 300.0000 mg | ORAL_TABLET | Freq: Every day | ORAL | Status: DC

## 2013-10-02 ENCOUNTER — Encounter: Payer: Self-pay | Admitting: *Deleted

## 2013-10-04 ENCOUNTER — Ambulatory Visit (INDEPENDENT_AMBULATORY_CARE_PROVIDER_SITE_OTHER): Payer: BC Managed Care – PPO | Admitting: Emergency Medicine

## 2013-10-04 ENCOUNTER — Encounter: Payer: Self-pay | Admitting: Emergency Medicine

## 2013-10-04 VITALS — BP 138/90 | HR 72 | Temp 98.0°F | Resp 18 | Ht 72.25 in | Wt 273.0 lb

## 2013-10-04 DIAGNOSIS — R030 Elevated blood-pressure reading, without diagnosis of hypertension: Secondary | ICD-10-CM

## 2013-10-04 DIAGNOSIS — E559 Vitamin D deficiency, unspecified: Secondary | ICD-10-CM

## 2013-10-04 DIAGNOSIS — R7309 Other abnormal glucose: Secondary | ICD-10-CM

## 2013-10-04 DIAGNOSIS — Z79899 Other long term (current) drug therapy: Secondary | ICD-10-CM

## 2013-10-04 DIAGNOSIS — M109 Gout, unspecified: Secondary | ICD-10-CM

## 2013-10-04 DIAGNOSIS — I10 Essential (primary) hypertension: Secondary | ICD-10-CM

## 2013-10-04 DIAGNOSIS — E782 Mixed hyperlipidemia: Secondary | ICD-10-CM

## 2013-10-04 DIAGNOSIS — Z0000 Encounter for general adult medical examination without abnormal findings: Secondary | ICD-10-CM

## 2013-10-04 LAB — CBC WITH DIFFERENTIAL/PLATELET
BASOS PCT: 1 % (ref 0–1)
Basophils Absolute: 0.1 10*3/uL (ref 0.0–0.1)
EOS ABS: 0.2 10*3/uL (ref 0.0–0.7)
EOS PCT: 2 % (ref 0–5)
HCT: 46.1 % (ref 39.0–52.0)
HEMOGLOBIN: 16.3 g/dL (ref 13.0–17.0)
LYMPHS ABS: 2.9 10*3/uL (ref 0.7–4.0)
Lymphocytes Relative: 35 % (ref 12–46)
MCH: 30.5 pg (ref 26.0–34.0)
MCHC: 35.4 g/dL (ref 30.0–36.0)
MCV: 86.3 fL (ref 78.0–100.0)
MONOS PCT: 9 % (ref 3–12)
Monocytes Absolute: 0.8 10*3/uL (ref 0.1–1.0)
Neutro Abs: 4.4 10*3/uL (ref 1.7–7.7)
Neutrophils Relative %: 53 % (ref 43–77)
Platelets: 308 10*3/uL (ref 150–400)
RBC: 5.34 MIL/uL (ref 4.22–5.81)
RDW: 14.3 % (ref 11.5–15.5)
WBC: 8.3 10*3/uL (ref 4.0–10.5)

## 2013-10-04 LAB — HEPATIC FUNCTION PANEL
ALK PHOS: 51 U/L (ref 39–117)
ALT: 34 U/L (ref 0–53)
AST: 20 U/L (ref 0–37)
Albumin: 4.6 g/dL (ref 3.5–5.2)
BILIRUBIN DIRECT: 0.1 mg/dL (ref 0.0–0.3)
BILIRUBIN INDIRECT: 0.4 mg/dL (ref 0.0–0.9)
Total Bilirubin: 0.5 mg/dL (ref 0.3–1.2)
Total Protein: 7.3 g/dL (ref 6.0–8.3)

## 2013-10-04 LAB — LIPID PANEL
CHOL/HDL RATIO: 7.1 ratio
Cholesterol: 284 mg/dL — ABNORMAL HIGH (ref 0–200)
HDL: 40 mg/dL (ref >39)
LDL Cholesterol: 186 mg/dL — ABNORMAL HIGH (ref 0–99)
Triglycerides: 288 mg/dL — ABNORMAL HIGH (ref <150)
VLDL: 58 mg/dL — ABNORMAL HIGH (ref 0–40)

## 2013-10-04 LAB — BASIC METABOLIC PANEL WITH GFR
BUN: 11 mg/dL (ref 6–23)
CALCIUM: 9.8 mg/dL (ref 8.4–10.5)
CO2: 30 mEq/L (ref 19–32)
CREATININE: 0.94 mg/dL (ref 0.50–1.35)
Chloride: 102 mEq/L (ref 96–112)
GFR, Est African American: >89 mL/min
GFR, Est Non African American: >89 mL/min
GLUCOSE: 81 mg/dL (ref 70–99)
Potassium: 4.2 mEq/L (ref 3.5–5.3)
SODIUM: 140 meq/L (ref 135–145)

## 2013-10-04 LAB — MAGNESIUM: Magnesium: 1.9 mg/dL (ref 1.5–2.5)

## 2013-10-04 LAB — HEMOGLOBIN A1C
Hgb A1c MFr Bld: 5.8 % — ABNORMAL HIGH (ref <5.7)
Mean Plasma Glucose: 120 mg/dL — ABNORMAL HIGH (ref <117)

## 2013-10-04 LAB — URIC ACID: Uric Acid, Serum: 6.7 mg/dL (ref 4.0–7.8)

## 2013-10-04 MED ORDER — CITALOPRAM HYDROBROMIDE 20 MG PO TABS: 20.0000 mg | ORAL_TABLET | Freq: Every day | ORAL | Status: DC

## 2013-10-04 MED ORDER — ROSUVASTATIN CALCIUM 20 MG PO TABS: 20.0000 mg | ORAL_TABLET | Freq: Every day | ORAL | Status: DC

## 2013-10-04 MED ORDER — COLCHICINE 0.6 MG PO TABS: 0.6000 mg | ORAL_TABLET | Freq: Every day | ORAL | Status: DC | PRN

## 2013-10-04 NOTE — Progress Notes (Signed)
Subjective:    Patient ID: [name], male    DOB: 1965-10-09, 48 y.o.   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 deficient LAST LABS T 271 TG 205 H 33 L 197  A1C 6.1  TESTOSTERONE 908 D 33 URIC 6.6  MAG 1.9 Simvastatin off x 6 months because difficulty taking pill, he wants to restart Crestor. He had tried in the past with out any SE. He eats descent. He is not exercising routinely because Gout flares. Most recent gout flare 2 weeks ago knees to ankle, colcrys helps, ETOH and exercise increase flare ups. He notes he drinks weekly but not excessively.  Gastrophageal Reflux  Hyperlipidemia   Current Outpatient Prescriptions on File Prior to Visit  Medication Sig Dispense Refill  . allopurinol (ZYLOPRIM) 300 MG tablet Take 1 tablet (300 mg total) by mouth daily.  30 tablet  1  . aspirin 325 MG tablet Take 325 mg by mouth daily.      Marland Kitchen omeprazole (PRILOSEC) 20 MG capsule Take 20 mg by mouth daily.       No current facility-administered medications on file prior to visit.   ALLERGIES Cymbalta and Lexapro  Past Medical History  Diagnosis Date  . Community acquired pneumonia   . Hypercholesteremia   . Depression   . Shortness of breath   . Chest pain   . Pulmonary embolism     6/12  . GERD (gastroesophageal reflux disease)   . Palpitations   . Gout   . Hypogonadism male   . Vitamin D deficiency     Past Surgical History  Procedure Laterality Date  . Eye surgery      Lasik  . Tonsillectomy and adenoidectomy    . Vasectomy     History  Substance Use Topics  . Smoking status: Never Smoker   . Smokeless tobacco: Never Used  . Alcohol Use: 1.5 - 2 oz/week    3-4 drink(s) per week     Comment: 1X WEEK    Family History  Problem Relation Age of Onset  . Prostate cancer Father   . Melanoma Father   . Cancer Father     Prostate Cancer  . Hypertension Father   .  Emphysema Maternal Grandmother     was a smoker  . Hyperlipidemia Maternal Grandmother   . Multiple sclerosis Maternal Uncle   . Stroke Maternal Grandfather   . Hyperlipidemia Maternal Grandfather       Review of Systems  Respiratory:       WERT PRN  Cardiovascular:       ECHO 10/2011 WNL EF 75%  Musculoskeletal: Positive for arthralgias and joint swelling.       With gout flares  Psychiatric/Behavioral:       MCKINNEY PRN  All other systems reviewed and are negative.   BP 138/90  Pulse 72  Temp(Src) 98 F (36.7 C) (Temporal)  Resp 18  Ht 6' 0.25" (1.835 m)  Wt 273 lb (123.832 kg)  BMI 36.78 kg/m2     Objective:   Physical Exam  Nursing note and vitals reviewed. Constitutional: He is oriented to person, place, and time. He appears well-developed and well-nourished.  Central Obesity  HENT:  Head: Normocephalic and atraumatic.  Right Ear: External ear normal.  Left Ear: External ear normal.  Nose: Nose normal.  Mouth/Throat: No oropharyngeal exudate.  Eyes: Conjunctivae and  EOM are normal. Pupils are equal, round, and reactive to light. Right eye exhibits no discharge. Left eye exhibits no discharge. No scleral icterus.  Neck: Normal range of motion. Neck supple. No JVD present. No tracheal deviation present. No thyromegaly present.  Cardiovascular: Normal rate, regular rhythm, normal heart sounds and intact distal pulses.   Pulmonary/Chest: Effort normal and breath sounds normal.  Abdominal: Soft. Bowel sounds are normal. He exhibits no distension and no mass. There is no tenderness. There is no rebound and no guarding.  Genitourinary:  DEF Urology  Musculoskeletal: Normal range of motion. He exhibits no edema and no tenderness.  Lymphadenopathy:    He has no cervical adenopathy.  Neurological: He is alert and oriented to person, place, and time. He has normal reflexes. No cranial nerve deficit. He exhibits normal muscle tone. Coordination normal.  Skin: Skin is  warm and dry. No rash noted. No erythema. No pallor.  Psychiatric: He has a normal mood and affect. His behavior is normal. Judgment and thought content normal.      EKG NSCSPT WNL   Assessment & Plan:  1. CPE and 3 month F/U for OBESITY, elevated BP without HTN, Cholesterol, Pre-Dm, D. Deficient. Needs healthy diet, cardio QD and obtain healthy weight. Check Labs, Check BP if >130/80 call office. Restart Crestor 10 mg SX given. RX sent in 40 mg due to insurance requirements patient will try 1/2 QOD of 40 or 10 mg QD.w/c with any concerns.  2. Gout- Check labs, decrease ETOH and follow gout diet, lose weight. Add Colcrys AD.Continue RX AD

## 2013-10-04 NOTE — Patient Instructions (Signed)
Fat and Cholesterol Control Diet Fat and cholesterol levels in your blood and organs are influenced by your diet. High levels of fat and cholesterol may lead to diseases of the heart, small and large blood vessels, gallbladder, liver, and pancreas. CONTROLLING FAT AND CHOLESTEROL WITH DIET Although exercise and lifestyle factors are important, your diet is key. That is because certain foods are known to raise cholesterol and others to lower it. The goal is to balance foods for their effect on cholesterol and more importantly, to replace saturated and trans fat with other types of fat, such as monounsaturated fat, polyunsaturated fat, and omega-3 fatty acids. On average, a person should consume no more than 15 to 17 g of saturated fat daily. Saturated and trans fats are considered "bad" fats, and they will raise LDL cholesterol. Saturated fats are primarily found in animal products such as meats, butter, and cream. However, that does not mean you need to give up all your favorite foods. Today, there are good tasting, low-fat, low-cholesterol substitutes for most of the things you like to eat. Choose low-fat or nonfat alternatives. Choose round or loin cuts of red meat. These types of cuts are lowest in fat and cholesterol. Chicken (without the skin), fish, veal, and ground turkey breast are great choices. Eliminate fatty meats, such as hot dogs and salami. Even shellfish have little or no saturated fat. Have a 3 oz (85 g) portion when you eat lean meat, poultry, or fish. Trans fats are also called "partially hydrogenated oils." They are oils that have been scientifically manipulated so that they are solid at room temperature resulting in a longer shelf life and improved taste and texture of foods in which they are added. Trans fats are found in stick margarine, some tub margarines, cookies, crackers, and baked goods.  When baking and cooking, oils are a great substitute for butter. The monounsaturated oils are  especially beneficial since it is believed they lower LDL and raise HDL. The oils you should avoid entirely are saturated tropical oils, such as coconut and palm.  Remember to eat a lot from food groups that are naturally free of saturated and trans fat, including fish, fruit, vegetables, beans, grains (barley, rice, couscous, bulgur wheat), and pasta (without cream sauces).  IDENTIFYING FOODS THAT LOWER FAT AND CHOLESTEROL  Soluble fiber may lower your cholesterol. This type of fiber is found in fruits such as apples, vegetables such as broccoli, potatoes, and carrots, legumes such as beans, peas, and lentils, and grains such as barley. Foods fortified with plant sterols (phytosterol) may also lower cholesterol. You should eat at least 2 g per day of these foods for a cholesterol lowering effect.  Read package labels to identify low-saturated fats, trans fat free, and low-fat foods at the supermarket. Select cheeses that have only 2 to 3 g saturated fat per ounce. Use a heart-healthy tub margarine that is free of trans fats or partially hydrogenated oil. When buying baked goods (cookies, crackers), avoid partially hydrogenated oils. Breads and muffins should be made from whole grains (whole-wheat or whole oat flour, instead of "flour" or "enriched flour"). Buy non-creamy canned soups with reduced salt and no added fats.  FOOD PREPARATION TECHNIQUES  Never deep-fry. If you must fry, either stir-fry, which uses very little fat, or use non-stick cooking sprays. When possible, broil, bake, or roast meats, and steam vegetables. Instead of putting butter or margarine on vegetables, use lemon and herbs, applesauce, and cinnamon (for squash and sweet potatoes). Use nonfat   yogurt, salsa, and low-fat dressings for salads.  LOW-SATURATED FAT / LOW-FAT FOOD SUBSTITUTES Meats / Saturated Fat (g)  Avoid: Steak, marbled (3 oz/85 g) / 11 g  Choose: Steak, lean (3 oz/85 g) / 4 g  Avoid: Hamburger (3 oz/85 g) / 7  g  Choose: Hamburger, lean (3 oz/85 g) / 5 g  Avoid: Ham (3 oz/85 g) / 6 g  Choose: Ham, lean cut (3 oz/85 g) / 2.4 g  Avoid: Chicken, with skin, dark meat (3 oz/85 g) / 4 g  Choose: Chicken, skin removed, dark meat (3 oz/85 g) / 2 g  Avoid: Chicken, with skin, light meat (3 oz/85 g) / 2.5 g  Choose: Chicken, skin removed, light meat (3 oz/85 g) / 1 g Dairy / Saturated Fat (g)  Avoid: Whole milk (1 cup) / 5 g  Choose: Low-fat milk, 2% (1 cup) / 3 g  Choose: Low-fat milk, 1% (1 cup) / 1.5 g  Choose: Skim milk (1 cup) / 0.3 g  Avoid: Hard cheese (1 oz/28 g) / 6 g  Choose: Skim milk cheese (1 oz/28 g) / 2 to 3 g  Avoid: Cottage cheese, 4% fat (1 cup) / 6.5 g  Choose: Low-fat cottage cheese, 1% fat (1 cup) / 1.5 g  Avoid: Ice cream (1 cup) / 9 g  Choose: Sherbet (1 cup) / 2.5 g  Choose: Nonfat frozen yogurt (1 cup) / 0.3 g  Choose: Frozen fruit bar / trace  Avoid: Whipped cream (1 tbs) / 3.5 g  Choose: Nondairy whipped topping (1 tbs) / 1 g Condiments / Saturated Fat (g)  Avoid: Mayonnaise (1 tbs) / 2 g  Choose: Low-fat mayonnaise (1 tbs) / 1 g  Avoid: Butter (1 tbs) / 7 g  Choose: Extra light margarine (1 tbs) / 1 g  Avoid: Coconut oil (1 tbs) / 11.8 g  Choose: Olive oil (1 tbs) / 1.8 g  Choose: Corn oil (1 tbs) / 1.7 g  Choose: Safflower oil (1 tbs) / 1.2 g  Choose: Sunflower oil (1 tbs) / 1.4 g  Choose: Soybean oil (1 tbs) / 2.4 g  Choose: Canola oil (1 tbs) / 1 g Document Released: 09/06/2005 Document Revised: 01/01/2013 Document Reviewed: 02/25/2011 [employer] Patient Information 2014 [place], [place]. Gout Gout is when your joints become red, sore, and swell (inflammed). This is caused by the buildup of uric acid crystals in the joints. Uric acid is a chemical that is normally in the blood. If the level of uric acid gets too high in the blood, these crystals form in your joints and tissues. Over time, these crystals can form into masses near the  joints and tissues. These masses can destroy bone and cause the bone to look misshapen (deformed). HOME CARE   Do not take aspirin for pain.  Only take medicine as told by your doctor.  Rest the joint as much as you can. When in bed, keep sheets and blankets off painful areas.  Keep the sore joints raised (elevated).  Put warm or cold packs on painful joints. Use of warm or cold packs depends on which works best for you.  Use crutches if the painful joint is in your leg.  Drink enough fluids to keep your pee (urine) clear or pale yellow. Limit alcohol, sugary drinks, and drinks with fructose in them.  Follow your diet instructions. Pay careful attention to how much protein you eat. Include fruits, vegetables, whole grains, and fat-free or low-fat milk products  in your daily diet. Talk to your doctor or dietician about the use of coffee, vitamin C, and cherries. These may help lower uric acid levels.  Keep a healthy body weight. GET HELP RIGHT AWAY IF:   You have watery poop (diarrhea), throw up (vomit), or have any side effects from medicines.  You do not feel better in 24 hours, or you are getting worse.  Your joint becomes suddenly more tender, and you have chills or a fever. MAKE SURE YOU:   Understand these instructions.  Will watch your condition.  Will get help right away if you are not doing well or get worse. Document Released: 06/15/2008 Document Revised: 01/01/2013 Document Reviewed: 12/15/2009 [facility] Patient Information 2014 [place], [place].

## 2013-10-05 LAB — URINALYSIS, ROUTINE W REFLEX MICROSCOPIC
BILIRUBIN URINE: NEGATIVE
Glucose, UA: NEGATIVE mg/dL
Hgb urine dipstick: NEGATIVE
Ketones, ur: NEGATIVE mg/dL
LEUKOCYTES UA: NEGATIVE
Nitrite: NEGATIVE
PH: 6 (ref 5.0–8.0)
PROTEIN: NEGATIVE mg/dL
SPECIFIC GRAVITY, URINE: 1.022 (ref 1.005–1.030)
Urobilinogen, UA: 0.2 mg/dL (ref 0.0–1.0)

## 2013-10-05 LAB — TSH: TSH: 1.715 u[IU]/mL (ref 0.350–4.500)

## 2013-10-05 LAB — INSULIN, FASTING: INSULIN FASTING, SERUM: 39 u[IU]/mL — AB (ref 3–28)

## 2013-10-05 LAB — VITAMIN D 25 HYDROXY (VIT D DEFICIENCY, FRACTURES): Vit D, 25-Hydroxy: 31 ng/mL (ref 30–89)

## 2013-10-05 LAB — MICROALBUMIN / CREATININE URINE RATIO
CREATININE, URINE: 200 mg/dL
MICROALB/CREAT RATIO: 4.9 mg/g (ref 0.0–30.0)
Microalb, Ur: 0.97 mg/dL (ref 0.00–1.89)

## 2013-10-08 ENCOUNTER — Other Ambulatory Visit: Payer: Self-pay | Admitting: Internal Medicine

## 2013-10-08 MED ORDER — ROSUVASTATIN CALCIUM 40 MG PO TABS: 40.0000 mg | ORAL_TABLET | Freq: Every day | ORAL | Status: DC

## 2013-10-27 ENCOUNTER — Other Ambulatory Visit: Payer: Self-pay | Admitting: Internal Medicine

## 2013-10-29 ENCOUNTER — Other Ambulatory Visit: Payer: Self-pay | Admitting: Emergency Medicine

## 2013-10-29 MED ORDER — ALLOPURINOL 300 MG PO TABS: 300.0000 mg | ORAL_TABLET | Freq: Every day | ORAL | Status: DC

## 2013-11-07 ENCOUNTER — Other Ambulatory Visit: Payer: Self-pay

## 2013-11-08 ENCOUNTER — Other Ambulatory Visit: Payer: Self-pay | Admitting: Emergency Medicine

## 2013-11-08 DIAGNOSIS — R7309 Other abnormal glucose: Secondary | ICD-10-CM

## 2013-11-08 DIAGNOSIS — R6889 Other general symptoms and signs: Secondary | ICD-10-CM

## 2013-11-09 ENCOUNTER — Other Ambulatory Visit: Payer: Self-pay

## 2013-12-26 ENCOUNTER — Telehealth: Payer: Self-pay | Admitting: *Deleted

## 2013-12-26 NOTE — Telephone Encounter (Signed)
STILL CONTINUES TO HAVE TROUBLE WITH GOUT ASKING FOR A REFERRAL TO A RHEUMATOLOGIST

## 2013-12-27 ENCOUNTER — Other Ambulatory Visit: Payer: Self-pay | Admitting: Emergency Medicine

## 2013-12-27 DIAGNOSIS — M109 Gout, unspecified: Secondary | ICD-10-CM

## 2014-01-08 ENCOUNTER — Ambulatory Visit (INDEPENDENT_AMBULATORY_CARE_PROVIDER_SITE_OTHER): Payer: BC Managed Care – PPO | Admitting: Emergency Medicine

## 2014-01-08 ENCOUNTER — Encounter: Payer: Self-pay | Admitting: Emergency Medicine

## 2014-01-08 VITALS — BP 138/96 | HR 82 | Temp 98.2°F | Resp 18 | Ht 72.25 in | Wt 273.0 lb

## 2014-01-08 DIAGNOSIS — Z79899 Other long term (current) drug therapy: Secondary | ICD-10-CM

## 2014-01-08 DIAGNOSIS — M2550 Pain in unspecified joint: Secondary | ICD-10-CM

## 2014-01-08 DIAGNOSIS — E559 Vitamin D deficiency, unspecified: Secondary | ICD-10-CM

## 2014-01-08 DIAGNOSIS — R030 Elevated blood-pressure reading, without diagnosis of hypertension: Secondary | ICD-10-CM

## 2014-01-08 DIAGNOSIS — E782 Mixed hyperlipidemia: Secondary | ICD-10-CM

## 2014-01-08 DIAGNOSIS — R7309 Other abnormal glucose: Secondary | ICD-10-CM

## 2014-01-08 DIAGNOSIS — R5381 Other malaise: Secondary | ICD-10-CM

## 2014-01-08 DIAGNOSIS — R5383 Other fatigue: Secondary | ICD-10-CM

## 2014-01-08 LAB — LIPID PANEL
CHOLESTEROL: 198 mg/dL (ref 0–200)
HDL: 40 mg/dL (ref >39)
LDL Cholesterol: 112 mg/dL — ABNORMAL HIGH (ref 0–99)
Total CHOL/HDL Ratio: 5 Ratio
Triglycerides: 231 mg/dL — ABNORMAL HIGH (ref <150)
VLDL: 46 mg/dL — ABNORMAL HIGH (ref 0–40)

## 2014-01-08 LAB — CBC WITH DIFFERENTIAL/PLATELET
BASOS PCT: 1 % (ref 0–1)
Basophils Absolute: 0.1 10*3/uL (ref 0.0–0.1)
Eosinophils Absolute: 0.2 10*3/uL (ref 0.0–0.7)
Eosinophils Relative: 2 % (ref 0–5)
HEMATOCRIT: 46.7 % (ref 39.0–52.0)
Hemoglobin: 16.9 g/dL (ref 13.0–17.0)
LYMPHS ABS: 2.6 10*3/uL (ref 0.7–4.0)
Lymphocytes Relative: 34 % (ref 12–46)
MCH: 30.8 pg (ref 26.0–34.0)
MCHC: 36.2 g/dL — ABNORMAL HIGH (ref 30.0–36.0)
MCV: 85.1 fL (ref 78.0–100.0)
MONO ABS: 0.5 10*3/uL (ref 0.1–1.0)
MONOS PCT: 7 % (ref 3–12)
NEUTROS ABS: 4.2 10*3/uL (ref 1.7–7.7)
Neutrophils Relative %: 56 % (ref 43–77)
Platelets: 287 10*3/uL (ref 150–400)
RBC: 5.49 MIL/uL (ref 4.22–5.81)
RDW: 13.7 % (ref 11.5–15.5)
WBC: 7.5 10*3/uL (ref 4.0–10.5)

## 2014-01-08 LAB — HEPATIC FUNCTION PANEL
ALBUMIN: 4.7 g/dL (ref 3.5–5.2)
ALT: 43 U/L (ref 0–53)
AST: 24 U/L (ref 0–37)
Alkaline Phosphatase: 47 U/L (ref 39–117)
BILIRUBIN TOTAL: 0.8 mg/dL (ref 0.2–1.2)
Bilirubin, Direct: 0.2 mg/dL (ref 0.0–0.3)
Indirect Bilirubin: 0.6 mg/dL (ref 0.2–1.2)
Total Protein: 7.6 g/dL (ref 6.0–8.3)

## 2014-01-08 LAB — HEMOGLOBIN A1C
Hgb A1c MFr Bld: 6 % — ABNORMAL HIGH (ref <5.7)
MEAN PLASMA GLUCOSE: 126 mg/dL — AB (ref <117)

## 2014-01-08 LAB — HEPATITIS PANEL, ACUTE
HCV AB: NEGATIVE
HEP B C IGM: NONREACTIVE
Hep A IgM: NONREACTIVE
Hepatitis B Surface Ag: NEGATIVE

## 2014-01-08 LAB — BASIC METABOLIC PANEL WITH GFR
BUN: 11 mg/dL (ref 6–23)
CALCIUM: 9.6 mg/dL (ref 8.4–10.5)
CO2: 27 mEq/L (ref 19–32)
Chloride: 101 mEq/L (ref 96–112)
Creat: 1.15 mg/dL (ref 0.50–1.35)
GFR, EST AFRICAN AMERICAN: 88 mL/min
GFR, Est Non African American: 76 mL/min
GLUCOSE: 101 mg/dL — AB (ref 70–99)
Potassium: 4.7 mEq/L (ref 3.5–5.3)
Sodium: 138 mEq/L (ref 135–145)

## 2014-01-08 LAB — SEDIMENTATION RATE: Sed Rate: 1 mm/hr (ref 0–16)

## 2014-01-08 LAB — URIC ACID: URIC ACID, SERUM: 5.3 mg/dL (ref 4.0–7.8)

## 2014-01-08 LAB — RHEUMATOID FACTOR: Rhuematoid fact SerPl-aCnc: <10 IU/mL (ref <=14)

## 2014-01-08 NOTE — Patient Instructions (Signed)
Arthritis, Nonspecific Arthritis is pain, redness, warmth, or puffiness (inflammation) of a joint. The joint may be stiff or hurt when you move it. One or more joints may be affected. There are many types of arthritis. Your doctor may not know what type you have right away. The most common cause of arthritis is wear and tear on the joint (osteoarthritis). HOME CARE   Only take medicine as told by your doctor.  Rest the joint as much as possible.  Raise (elevate) your joint if it is puffy.  Use crutches if the painful joint is in your leg.  Drink enough fluids to keep your pee (urine) clear or pale yellow.  Follow your doctor's diet instructions.  Use cold packs for very bad joint pain for 10 to 15 minutes every hour. Ask your doctor if it is okay for you to use hot packs.  Exercise as told by your doctor.  Take a warm shower if you have stiffness in the morning.  Move your sore joints throughout the day. GET HELP RIGHT AWAY IF:   You have a fever.  You have very bad joint pain, puffiness, or redness.  You have many joints that are painful and puffy.  You are not getting better with treatment.  You have very bad back pain or leg weakness.  You cannot control when you poop (bowel movement) or pee (urinate).  You do not feel better in 24 hours or are getting worse.  You are having side effects from your medicine. MAKE SURE YOU:   Understand these instructions.  Will watch your condition.  Will get help right away if you are not doing well or get worse. Document Released: 12/01/2009 Document Revised: 03/07/2012 Document Reviewed: 12/01/2009 [facility] Patient Information 2014 [place], [place]. Gout Gout is when your joints become red, sore, and swell (inflammed). This is caused by the buildup of uric acid crystals in the joints. Uric acid is a chemical that is normally in the blood. If the level of uric acid gets too high in the blood, these crystals form in your joints and  tissues. Over time, these crystals can form into masses near the joints and tissues. These masses can destroy bone and cause the bone to look misshapen (deformed). HOME CARE   Do not take aspirin for pain.  Only take medicine as told by your doctor.  Rest the joint as much as you can. When in bed, keep sheets and blankets off painful areas.  Keep the sore joints raised (elevated).  Put warm or cold packs on painful joints. Use of warm or cold packs depends on which works best for you.  Use crutches if the painful joint is in your leg.  Drink enough fluids to keep your pee (urine) clear or pale yellow. Limit alcohol, sugary drinks, and drinks with fructose in them.  Follow your diet instructions. Pay careful attention to how much protein you eat. Include fruits, vegetables, whole grains, and fat-free or low-fat milk products in your daily diet. Talk to your doctor or dietician about the use of coffee, vitamin C, and cherries. These may help lower uric acid levels.  Keep a healthy body weight. GET HELP RIGHT AWAY IF:   You have watery poop (diarrhea), throw up (vomit), or have any side effects from medicines.  You do not feel better in 24 hours, or you are getting worse.  Your joint becomes suddenly more tender, and you have chills or a fever. MAKE SURE YOU:   Understand  these instructions.  Will watch your condition.  Will get help right away if you are not doing well or get worse. Document Released: 06/15/2008 Document Revised: 01/01/2013 Document Reviewed: 12/15/2009 [facility] Patient Information 2014 [place], [place]. Rheumatoid Arthritis Rheumatoid arthritis is a disease that causes pain, puffiness (swelling), and stiffness of the joints. It is a long-term (chronic) disease. It can affect the whole body, even the eyes and lungs.  HOME CARE  Stay active, but lessen activity when the disease gets worse.  Eat healthy foods.  Put heat on the affected joints when you wake up  and before activity. Keep the heat on for as long as told by your doctor.  Put ice on the affected joints after activity or exercise.  Put ice in a plastic bag.  Place a towel between your skin and the bag.  Leave the ice on for 15-20 minutes, 03-04 times a day.  Take all medicine and other dietary pills (supplements) as told by your doctor.  Use a splint as told by your doctor. Splints help keep joints in a certain position to keep the joint working right.  Do not sleep with pillows under your knees.  Go to programs that can keep you updated on treatments and ways to deal with your disease. GET HELP RIGHT AWAY IF:  You have times where you pass out (faint).  You have times where you are really weak.  You suddenly have a hot, painful joint that feels worse than your normal joint ache.  You have chills.  You have a fever. MAKE SURE YOU:   Understand these instructions.  Will watch your condition.  Will get help right away if you are not doing well or get worse. Document Released: 11/29/2011 Document Reviewed: 11/29/2011 [facility] Patient Information 2014 [place], [place].

## 2014-01-08 NOTE — Progress Notes (Signed)
Subjective:    Patient ID: Donald BeamsScott G Snow, male    DOB: 04/08/1966, 48 y.o.   MRN: [id number]  HPI Comments: 48 yo WM presents for 3 month F/U for elevated BP with out HTN, Cholesterol, Pre-Dm, D. Deficient. He is not exercising due to pain in ankle. He is trying to improve diet. He has decreased ETOH.  Last labs  CHOL         284   10/04/2013 HDL           40   10/04/2013 LDLCALC      186   10/04/2013 TRIG         288   10/04/2013 CHOLHDL      7.1   10/04/2013 ALT           34   10/04/2013 AST           20   10/04/2013 ALKPHOS       51   10/04/2013 BILITOT      0.5   10/04/2013 CREATININE     0.94   10/04/2013 BUN              11   10/04/2013 NA              140   10/04/2013 K               4.2   10/04/2013 CL              102   10/04/2013 CO2              30   10/04/2013 Uric acid 6.7  VIT D 31 HGBA1C      5.8   10/04/2013   He has been having more gout flares with Allopurinol and has recently switched to Uloric. Dr Link Snuffereveschwar is concerned may have other autoimmune. He notes left ankle always worse pain, occasionally knee ain. He has had negative xrays at [facility] except for spurring.   Hyperlipidemia  Gastrophageal Reflux Associated symptoms include fatigue.      Medication List       This list is accurate as of: 01/08/14  9:04 AM.  Always use your most recent med list.               aspirin 325 MG tablet  Take 325 mg by mouth daily.     AXIRON 30 MG/ACT Soln  Generic drug:  Testosterone  Place onto the skin daily.     citalopram 20 MG tablet  Commonly known as:  CELEXA  Take 1 tablet (20 mg total) by mouth daily.     colchicine 0.6 MG tablet  Take 1 tablet (0.6 mg total) by mouth daily as needed.     divalproex 250 MG DR tablet  Commonly known as:  DEPAKOTE  TAKE 1 TABLET DAILY     omeprazole 20 MG capsule  Commonly known as:  PRILOSEC  Take 20 mg by mouth daily.     rosuvastatin 40 MG tablet  Commonly known as:  CRESTOR  Take 1 tablet (40 mg total) by mouth daily.      ULORIC 40 MG tablet  Generic drug:  febuxostat  Take 40 mg by mouth daily.       Allergies  Allergen Reactions  . Cymbalta [Duloxetine Hcl]   . Lexapro [Escitalopram]    Past Medical History  Diagnosis Date  . Community acquired pneumonia   . Hypercholesteremia   . Depression   .  Shortness of breath   . Chest pain   . Pulmonary embolism     6/12  . GERD (gastroesophageal reflux disease)   . Palpitations   . Gout   . Hypogonadism male   . Vitamin D deficiency       Review of Systems  Constitutional: Positive for fatigue.  Musculoskeletal: Positive for arthralgias.  All other systems reviewed and are negative.  BP 138/96  Pulse 82  Temp(Src) 98.2 F (36.8 C) (Temporal)  Resp 18  Ht 6' 0.25" (1.835 m)  Wt 273 lb (123.832 kg)  BMI 36.78 kg/m2     Objective:   Physical Exam  Nursing note and vitals reviewed. Constitutional: He is oriented to person, place, and time. He appears well-developed and well-nourished.  Central obesity  HENT:  Head: Normocephalic and atraumatic.  Right Ear: External ear normal.  Left Ear: External ear normal.  Nose: Nose normal.  Eyes: Conjunctivae and EOM are normal.  Neck: Normal range of motion. Neck supple. No JVD present. No thyromegaly present.  Cardiovascular: Normal rate, regular rhythm, normal heart sounds and intact distal pulses.   Pulmonary/Chest: Effort normal and breath sounds normal.  Abdominal: Soft. Bowel sounds are normal. He exhibits no distension and no mass. There is no tenderness. There is no rebound and no guarding.  Musculoskeletal: Normal range of motion. He exhibits no edema and no tenderness.  Lymphadenopathy:    He has no cervical adenopathy.  Neurological: He is alert and oriented to person, place, and time. He has normal reflexes. No cranial nerve deficit. Coordination normal.  Skin: Skin is warm and dry.  Psychiatric: He has a normal mood and affect. His behavior is normal. Judgment and thought  content normal.          Assessment & Plan:  1.  3 month F/U for elevated BP w/o  HTN, Cholesterol, Pre-Dm, D. Deficient. Needs healthy diet, cardio QD and obtain healthy weight. Check Labs, Check BP if >130/80 call office   2. Arthralgias vs gout vs vs Fatigue- check labs, increase activity and H2O

## 2014-01-09 LAB — VITAMIN D 25 HYDROXY (VIT D DEFICIENCY, FRACTURES): Vit D, 25-Hydroxy: 28 ng/mL — ABNORMAL LOW (ref 30–89)

## 2014-01-09 LAB — INSULIN, FASTING: Insulin fasting, serum: 28 u[IU]/mL (ref 3–28)

## 2014-01-09 LAB — CYCLIC CITRUL PEPTIDE ANTIBODY, IGG: Cyclic Citrullin Peptide Ab: <2 U/mL (ref 0.0–5.0)

## 2014-01-09 LAB — ANA: Anti Nuclear Antibody(ANA): NEGATIVE

## 2014-01-10 LAB — QUANTIFERON TB GOLD ASSAY (BLOOD)
Interferon Gamma Release Assay: NEGATIVE
Mitogen value: >10 IU/mL
Quantiferon Nil Value: 0.03 IU/mL
Quantiferon Tb Ag Minus Nil Value: 0 IU/mL
TB AG VALUE: 0.03 [IU]/mL

## 2014-01-10 LAB — HLA-B27 ANTIGEN: DNA RESULT: NOT DETECTED

## 2014-01-10 LAB — GLUCOSE 6 PHOSPHATE DEHYDROGENASE: G-6PDH: 14.1 U/g Hgb (ref 7.0–20.5)

## 2014-01-16 ENCOUNTER — Encounter: Payer: Self-pay | Admitting: *Deleted

## 2014-02-03 ENCOUNTER — Encounter (HOSPITAL_COMMUNITY): Payer: Self-pay | Admitting: Emergency Medicine

## 2014-02-03 ENCOUNTER — Emergency Department (INDEPENDENT_AMBULATORY_CARE_PROVIDER_SITE_OTHER)
Admission: EM | Admit: 2014-02-03 | Discharge: 2014-02-03 | Disposition: A | Discharge status: Home / Self Care | Payer: BC Managed Care – PPO | Source: Home / Self Care

## 2014-02-03 DIAGNOSIS — R059 Cough, unspecified: Secondary | ICD-10-CM

## 2014-02-03 DIAGNOSIS — B9789 Other viral agents as the cause of diseases classified elsewhere: Secondary | ICD-10-CM

## 2014-02-03 DIAGNOSIS — R05 Cough: Secondary | ICD-10-CM

## 2014-02-03 DIAGNOSIS — B349 Viral infection, unspecified: Secondary | ICD-10-CM

## 2014-02-03 LAB — POCT RAPID STREP A: Streptococcus, Group A Screen (Direct): NEGATIVE

## 2014-02-03 MED ORDER — HYDROCOD POLST-CHLORPHEN POLST 10-8 MG/5ML PO LQCR: 5.0000 mL | Freq: Two times a day (BID) | ORAL | Status: DC | PRN

## 2014-02-03 NOTE — ED Notes (Signed)
Pt  Reports  Symptoms  Of  couigh   /  Congested      With  sorethroat       With  The  Symptoms  X  5  Days          Pt  Reports  The  Cough  Os  For  The  Most part  Productive            He  Reports  He  Has  Had  Symptoms  Of fever   As  Well  Took  Some  Tylenol  Earlier

## 2014-02-03 NOTE — ED Provider Notes (Signed)
CSN: [id number]     Arrival date & time 02/03/14  1255 History   None    Chief Complaint  Patient presents with  . Cough   (Consider location/radiation/quality/duration/timing/severity/associated sxs/prior Treatment)  HPI  Patient is a 48 year old male presenting today with reports of 5 days of productive green cough and "not feeling well".  The patient denies a history of seasonal or environmental allergies. He states he has been taking Mucinex and over-the-counter cough syrup with minimal relief. The patient denies any nasal discharge or congestion and has no history of asthma and is a never smoker. The patient states he has had a low-grade fever but denies nausea vomiting or diarrhea. Patient states he has a history of a PE approximately 3 years ago, but this "feels different".  Past Medical History  Diagnosis Date  . Community acquired pneumonia   . Hypercholesteremia   . Depression   . Shortness of breath   . Chest pain   . Pulmonary embolism     6/12  . GERD (gastroesophageal reflux disease)   . Palpitations   . Gout   . Hypogonadism male   . Vitamin D deficiency    Past Surgical History  Procedure Laterality Date  . Eye surgery      Lasik  . Tonsillectomy and adenoidectomy    . Vasectomy     Family History  Problem Relation Age of Onset  . Prostate cancer Father   . Melanoma Father   . Cancer Father     Prostate Cancer  . Hypertension Father   . Emphysema Maternal Grandmother     was a smoker  . Hyperlipidemia Maternal Grandmother   . Multiple sclerosis Maternal Uncle   . Stroke Maternal Grandfather   . Hyperlipidemia Maternal Grandfather    History  Substance Use Topics  . Smoking status: Never Smoker   . Smokeless tobacco: Never Used  . Alcohol Use: 1.5 - 2.0 oz/week    3-4 drink(s) per week     Comment: 1X WEEK    Review of Systems  Constitutional: Positive for fever. Negative for chills.  HENT: Positive for sore throat. Negative for ear pain,  sinus pressure, sneezing, trouble swallowing and voice change.   Eyes: Negative.   Respiratory: Positive for cough. Negative for choking, chest tightness, shortness of breath, wheezing and stridor.   Cardiovascular: Positive for chest pain. Negative for palpitations and leg swelling.       Patient reports diffuse anterior chest pain reproducible and made worse with coughing.  Gastrointestinal: Negative.  Negative for nausea, vomiting and diarrhea.       Hx of GERD, takes omeprazole.  Endocrine: Negative.   Genitourinary: Negative.   Musculoskeletal: Negative.   Skin: Negative.  Negative for pallor.  Allergic/Immunologic: Negative.  Negative for environmental allergies and food allergies.  Neurological: Negative.   Hematological: Negative.        History of gout.  Psychiatric/Behavioral: The patient is not nervous/anxious.        History of depression for which he is taking medication.    Allergies  Cymbalta and Lexapro  Home Medications   Prior to Admission medications   Medication Sig Start Date End Date Taking? Authorizing Provider  aspirin 325 MG tablet Take 325 mg by mouth daily.    Historical Provider, MD  citalopram (CELEXA) 20 MG tablet Take 1 tablet (20 mg total) by mouth daily. 10/04/13   Melissa R Smith, PA-C  colchicine 0.6 MG tablet Take 1 tablet (0.6 mg  total) by mouth daily as needed. 10/04/13   Melissa R Smith, PA-C  divalproex (DEPAKOTE) 250 MG DR tablet TAKE 1 TABLET DAILY 10/27/13   Melissa R Smith, PA-C  febuxostat (ULORIC) 40 MG tablet Take 40 mg by mouth daily.    Historical Provider, MD  omeprazole (PRILOSEC) 20 MG capsule Take 20 mg by mouth daily.    Historical Provider, MD  rosuvastatin (CRESTOR) 40 MG tablet Take 1 tablet (40 mg total) by mouth daily. 10/08/13   Melissa R Smith, PA-C  Testosterone (AXIRON) 30 MG/ACT SOLN Place onto the skin daily.    Historical Provider, MD   BP 118/72  Pulse 72  Temp(Src) 98.6 F (37 C) (Oral)  Resp 18  SpO2  96%   Physical Exam  Nursing note and vitals reviewed. Constitutional: He is oriented to person, place, and time. He appears well-developed and well-nourished. No distress.  HENT:  Head: Normocephalic and atraumatic.  Right Ear: Tympanic membrane, external ear and ear canal normal.  Left Ear: Tympanic membrane, external ear and ear canal normal.  Nose: Nose normal.  Mouth/Throat: Oropharynx is clear and moist.  Mild erythema noted in posterior oropharynx; no evidence of patches or exudate. Mucoid discharge present.  Neck: Normal range of motion.  Cardiovascular: Normal rate, regular rhythm, normal heart sounds and intact distal pulses.  Exam reveals no gallop and no friction rub.   No murmur heard. Negative pedal edema.  Pulmonary/Chest: Effort normal and breath sounds normal. No respiratory distress. He has no wheezes. He has no rales. He exhibits no tenderness.  No adventitious breath sounds noted.  Lymphadenopathy:    He has no cervical adenopathy.  Neurological: He is alert and oriented to person, place, and time.  Skin: Skin is warm and dry. No rash noted. He is not diaphoretic. No erythema. No pallor.    ED Course  Procedures (including critical care time) Labs Review Labs Reviewed  POCT RAPID STREP A ([facility] CARE ONLY)    Imaging Review No results found.   MDM   1. Viral illness   2. Cough    Meds ordered this encounter  Medications  . chlorpheniramine-HYDROcodone (TUSSIONEX PENNKINETIC ER) 10-8 MG/5ML LQCR    Sig: Take 5 mLs by mouth every 12 (twelve) hours as needed for cough.    Dispense:  115 mL    Refill:  0   The patient verbalizes understanding and agrees to plan of care.        Weber Cooksatherine Tihanna Goodson, NP 02/03/14 1457

## 2014-02-03 NOTE — Discharge Instructions (Signed)
Antibiotic Nonuse   Your caregiver felt that the infection or problem was not one that would be helped with an antibiotic.  Infections may be caused by viruses or bacteria. Only a caregiver can tell which one of these is the likely cause of an illness. A cold is the most common cause of infection in both adults and children. A cold is a virus. Antibiotic treatment will have no effect on a viral infection. Viruses can lead to many lost days of work caring for sick children and many missed days of school. Children may catch as many as 10 "colds" or "flus" per year during which they can be tearful, cranky, and uncomfortable. The goal of treating a virus is aimed at keeping the ill person comfortable.  Antibiotics are medications used to help the body fight bacterial infections. There are relatively few types of bacteria that cause infections but there are hundreds of viruses. While both viruses and bacteria cause infection they are very different types of germs. A viral infection will typically go away by itself within 7 to 10 days. Bacterial infections may spread or get worse without antibiotic treatment.  Examples of bacterial infections are:   Sore throats (like strep throat or tonsillitis).   Infection in the lung (pneumonia).   Ear and skin infections.  Examples of viral infections are:   Colds or flus.   Most coughs and bronchitis.   Sore throats not caused by Strep.   Runny noses.  It is often best not to take an antibiotic when a viral infection is the cause of the problem. Antibiotics can kill off the helpful bacteria that we have inside our body and allow harmful bacteria to start growing. Antibiotics can cause side effects such as allergies, nausea, and diarrhea without helping to improve the symptoms of the viral infection. Additionally, repeated uses of antibiotics can cause bacteria inside of our body to become resistant. That resistance can be passed onto harmful bacterial. The next time you have  an infection it may be harder to treat if antibiotics are used when they are not needed. Not treating with antibiotics allows our own immune system to develop and take care of infections more efficiently. Also, antibiotics will work better for us when they are prescribed for bacterial infections.  Treatments for a child that is ill may include:   Give extra fluids throughout the day to stay hydrated.   Get plenty of rest.   Only give your child over-the-counter or prescription medicines for pain, discomfort, or fever as directed by your caregiver.   The use of a cool mist humidifier may help stuffy noses.   Cold medications if suggested by your caregiver.  Your caregiver may decide to start you on an antibiotic if:   The problem you were seen for today continues for a longer length of time than expected.   You develop a secondary bacterial infection.  SEEK MEDICAL CARE IF:   Fever lasts longer than 5 days.   Symptoms continue to get worse after 5 to 7 days or become severe.   Difficulty in breathing develops.   Signs of dehydration develop (poor drinking, rare urinating, dark colored urine).   Changes in behavior or worsening tiredness (listlessness or lethargy).  Document Released: 11/15/2001 Document Revised: 11/29/2011 Document Reviewed: 05/14/2009  [employer] Patient Information 2014 [employer].

## 2014-02-05 LAB — CULTURE, GROUP A STREP

## 2014-02-06 NOTE — ED Provider Notes (Signed)
Medical screening examination/treatment/procedure(s) were performed by a resident physician or non-physician practitioner and as the supervising physician I was immediately available for consultation/collaboration.  Dorena Dorfman, MD    Kannen Moxey S Ann Groeneveld, MD 02/06/14 0751 

## 2014-03-13 ENCOUNTER — Encounter: Payer: Self-pay | Admitting: Cardiology

## 2014-04-03 ENCOUNTER — Encounter: Payer: Self-pay | Admitting: *Deleted

## 2014-04-09 ENCOUNTER — Ambulatory Visit: Payer: Self-pay

## 2014-04-10 ENCOUNTER — Ambulatory Visit (INDEPENDENT_AMBULATORY_CARE_PROVIDER_SITE_OTHER): Payer: BC Managed Care – PPO | Admitting: Podiatry

## 2014-04-10 ENCOUNTER — Encounter: Payer: Self-pay | Admitting: Podiatry

## 2014-04-10 ENCOUNTER — Ambulatory Visit (INDEPENDENT_AMBULATORY_CARE_PROVIDER_SITE_OTHER): Payer: BC Managed Care – PPO

## 2014-04-10 VITALS — BP 133/93 | HR 87 | Resp 16 | Ht 72.0 in | Wt 265.0 lb

## 2014-04-10 DIAGNOSIS — M722 Plantar fascial fibromatosis: Secondary | ICD-10-CM

## 2014-04-10 DIAGNOSIS — M7660 Achilles tendinitis, unspecified leg: Secondary | ICD-10-CM

## 2014-04-10 MED ORDER — TRIAMCINOLONE ACETONIDE 10 MG/ML IJ SUSP
10.0000 mg | Freq: Once | INTRAMUSCULAR | Status: AC
  Administered 2014-04-10: 10 mg

## 2014-04-10 NOTE — Progress Notes (Signed)
   Subjective:    Patient ID: [name], male    DOB: 05/10/1966, 48 y.o.   MRN: [id number]  HPI Comments: i have left heel pain. Ive had it for 6 months. It hurts to walk and stand. Somedays are worse than others. The pain is about the same. i had x-rays and it showed a bone spur. i went to [facility] and seen dr devashwar for those. i dont do anything for my heel pain  Foot Pain      Review of Systems  Musculoskeletal:       Joint pain Difficulty walking  All other systems reviewed and are negative.      Objective:   Physical Exam        Assessment & Plan:

## 2014-04-10 NOTE — Patient Instructions (Signed)

## 2014-04-11 NOTE — Progress Notes (Signed)
Subjective:     Patient ID: Donald BeamsScott G Dascoli, male   DOB: 04/06/1966, 48 y.o.   MRN: [id number]  Foot Pain   patient presents stating that he has had heel pain for 6 months and it has gradually gotten worse. Has been to another doctor who diagnosed with bone spur but did not treat and he has been checked for different types of arthritis and  does not have anything currently active   Review of Systems  All other systems reviewed and are negative.      Objective:   Physical Exam  Nursing note and vitals reviewed. Constitutional: He is oriented to person, place, and time.  Cardiovascular: Intact distal pulses.   Musculoskeletal: Normal range of motion.  Neurological: He is oriented to person, place, and time.  Skin: Skin is warm.   neurovascular status found to be intact with muscle strength adequate and range of motion of the subtalar and midtarsal joint within normal limits. Mild equinus condition was noted and a noted quite a bit of pain in the posterior lateral aspect of the insertion of the Achilles tendon left foot and mild plantar pain along the medial fascially band. Digits are well-perfused and arch height is normal    Assessment:     Achilles tendinitis left lateral side with inflammation and possible moderate plantar fasciitis    Plan:     Initial H&P and x-rays reviewed with patient. Today I injected the lateral side of the Achilles tendon 3 mg dexamethasone Kenalog 5 mg Xylocaine after explaining the risk of rupture which patient understands completely. I also applied an air fracture walker to immobilize and help to prevent any complications occurring and allow rest to occur. Placed on anti-inflammatory diclofenac 75 mg twice a day and reappoint again in the next 3 weeks

## 2014-04-29 ENCOUNTER — Other Ambulatory Visit: Payer: Self-pay | Admitting: *Deleted

## 2014-04-29 MED ORDER — ROSUVASTATIN CALCIUM 40 MG PO TABS: 40.0000 mg | ORAL_TABLET | Freq: Every day | ORAL | Status: DC

## 2014-04-30 ENCOUNTER — Other Ambulatory Visit: Payer: Self-pay | Admitting: *Deleted

## 2014-04-30 MED ORDER — FEBUXOSTAT 40 MG PO TABS: 40.0000 mg | ORAL_TABLET | Freq: Every day | ORAL | Status: AC

## 2014-04-30 MED ORDER — ROSUVASTATIN CALCIUM 40 MG PO TABS: 40.0000 mg | ORAL_TABLET | Freq: Every day | ORAL | Status: DC

## 2014-05-01 ENCOUNTER — Ambulatory Visit: Payer: BC Managed Care – PPO | Admitting: Podiatry

## 2014-05-02 ENCOUNTER — Other Ambulatory Visit: Payer: Self-pay | Admitting: Internal Medicine

## 2014-05-02 ENCOUNTER — Ambulatory Visit: Payer: BC Managed Care – PPO | Admitting: Podiatry

## 2014-05-02 MED ORDER — COLCHICINE 0.6 MG PO TABS
ORAL_TABLET | ORAL | Status: DC
Start: 2014-05-02 — End: 2014-08-26

## 2014-05-02 MED ORDER — DIVALPROEX SODIUM 250 MG PO DR TAB: DELAYED_RELEASE_TABLET | ORAL | Status: DC

## 2014-05-10 ENCOUNTER — Ambulatory Visit (INDEPENDENT_AMBULATORY_CARE_PROVIDER_SITE_OTHER): Payer: BC Managed Care – PPO | Admitting: Podiatry

## 2014-05-10 ENCOUNTER — Encounter: Payer: Self-pay | Admitting: Podiatry

## 2014-05-10 VITALS — BP 124/77 | HR 69 | Resp 18

## 2014-05-10 DIAGNOSIS — M898X9 Other specified disorders of bone, unspecified site: Secondary | ICD-10-CM

## 2014-05-10 DIAGNOSIS — M7660 Achilles tendinitis, unspecified leg: Secondary | ICD-10-CM

## 2014-05-10 DIAGNOSIS — M898X7 Other specified disorders of bone, ankle and foot: Secondary | ICD-10-CM

## 2014-05-10 NOTE — Progress Notes (Signed)
   Subjective:    Patient ID: Donald BeamsScott G Feenstra, male    DOB: 09/15/1966, 48 y.o.   MRN: [id number]  HPI   48 year old male returns the office today for followup evaluation of left foot insertional Achilles tendinitis with retrocalcaneal exostosis. DOS appointment the patient had a steroid injection to the inflamed area. States that he received pain relief for approximately one week but the pain has since returned. He has discontinued wearing the cam boot. He presents today wearing shoes. States that he is considering surgical intervention as the pain is ongoing and continuous.  No other complaints at this time.     Review of Systems  All other systems reviewed and are negative.      Objective:   Physical Exam DP/PT pulses palpable 2/4 b/l. CRT < 3 sec. Sensation intact the [facility] monofil[facility]. Vibratory sensation intact. Left lateral posterior heel prominence with pain on palpation to the area and at the insertion of the achilles tendon. No pain along the course of the Achilles tendon. Achilles tendon is intact. No pain on the plantar aspect of the heel or with lateral compression. Equinus b/l.  MMT 5/5.      Assessment & Plan:  48 year old male with left retrocalcaneal exostosis, symptomatic  -Prior x-rays reviewed that were obtained and possibly. -Dispensed night splint -Discussed stretching exercises. -The patient was inquiring about surgical intervention this was discussed with him at great length including alternatives, risks, complications. Patient was also provided a link the [employer] website so he can learn more about the procedure and the product that will most likely be used ([employer] speed bridge).  -Will continue with conservative treatment at this time. If pain continues, we will consider surgical intervention.  -F/U in 3 weeks, or sooner if any problems are to arise.

## 2014-05-10 NOTE — Patient Instructions (Signed)
If you consider the surgery here is a link to a video you can watch. We would most likely use what is called the [employer] Speed Bridge to take the bump off the back of the heel and reattachment with this device. Here is a link: https://www.arthrex.com/resources/video/[email or web address]-[email or web address]/[email or web address]-[email or web address]    Achilles Tendinitis  with Rehab Achilles tendinitis is a disorder of the Achilles tendon. The Achilles tendon connects the large calf muscles (Gastrocnemius and Soleus) to the heel bone (calcaneus). This tendon is sometimes called the heel cord. It is important for pushing-off and standing on your toes and is important for walking, running, or jumping. Tendinitis is often caused by overuse and repetitive microtrauma. SYMPTOMS  Pain, tenderness, swelling, warmth, and redness may occur over the Achilles tendon even at rest.  Pain with pushing off, or flexing or extending the ankle.  Pain that is worsened after or during activity. CAUSES   Overuse sometimes seen with rapid increase in exercise programs or in sports requiring running and jumping.  Poor physical conditioning (strength and flexibility or endurance).  Running sports, especially training running down hills.  Inadequate warm-up before practice or play or failure to stretch before participation.  Injury to the tendon. PREVENTION   Warm up and stretch before practice or competition.  Allow time for adequate rest and recovery between practices and competition.  Keep up conditioning.  Keep up ankle and leg flexibility.  Improve or keep muscle strength and endurance.  Improve cardiovascular fitness.  Use proper technique.  Use proper equipment (shoes, skates).  To help prevent recurrence, taping, protective strapping, or an adhesive bandage may be recommended for several weeks after healing is complete. PROGNOSIS   Recovery may take weeks to several months to heal.  Longer recovery is expected if symptoms  have been prolonged.  Recovery is usually quicker if the inflammation is due to a direct blow as compared with overuse or sudden strain. RELATED COMPLICATIONS   Healing time will be prolonged if the condition is not correctly treated. The injury must be given plenty of time to heal.  Symptoms can reoccur if activity is resumed too soon.  Untreated, tendinitis may increase the risk of tendon rupture requiring additional time for recovery and possibly surgery. TREATMENT   The first treatment consists of rest anti-inflammatory medication, and ice to relieve the pain.  Stretching and strengthening exercises after resolution of pain will likely help reduce the risk of recurrence. Referral to a physical therapist or athletic trainer for further evaluation and treatment may be helpful.  A walking boot or cast may be recommended to rest the Achilles tendon. This can help break the cycle of inflammation and microtrauma.  Arch supports (orthotics) may be prescribed or recommended by your caregiver as an adjunct to therapy and rest.  Surgery to remove the inflamed tendon lining or degenerated tendon tissue is rarely necessary and has shown less than predictable results. MEDICATION   Nonsteroidal anti-inflammatory medications, such as aspirin and ibuprofen, may be used for pain and inflammation relief. Do not take within 7 days before surgery. Take these as directed by your caregiver. Contact your caregiver immediately if any bleeding, stomach upset, or signs of allergic reaction occur. Other minor pain relievers, such as acetaminophen, may also be used.  Pain relievers may be prescribed as necessary by your caregiver. Do not take prescription pain medication for longer than 4 to 7 days. Use only as directed and only as much as you need.  Cortisone injections are rarely indicated. Cortisone injections  may weaken tendons and predispose to rupture. It is better to give the condition more time to heal  than to use them. HEAT AND COLD  Cold is used to relieve pain and reduce inflammation for acute and chronic Achilles tendinitis. Cold should be applied for 10 to 15 minutes every 2 to 3 hours for inflammation and pain and immediately after any activity that aggravates your symptoms. Use ice packs or an ice massage.  Heat may be used before performing stretching and strengthening activities prescribed by your caregiver. Use a heat pack or a warm soak. SEEK MEDICAL CARE IF:  Symptoms get worse or do not improve in 2 weeks despite treatment.  New, unexplained symptoms develop. Drugs used in treatment may produce side effects. EXERCISES RANGE OF MOTION (ROM) AND STRETCHING EXERCISES - Achilles Tendinitis  These exercises may help you when beginning to rehabilitate your injury. Your symptoms may resolve with or without further involvement from your physician, physical therapist or athletic trainer. While completing these exercises, remember:   Restoring tissue flexibility helps normal motion to return to the joints. This allows healthier, less painful movement and activity.  An effective stretch should be held for at least 30 seconds.  A stretch should never be painful. You should only feel a gentle lengthening or release in the stretched tissue. STRETCH - Gastroc, Standing   Place hands on wall.  Extend right / left leg, keeping the front knee somewhat bent.  Slightly point your toes inward on your back foot.  Keeping your right / left heel on the floor and your knee straight, shift your weight toward the wall, not allowing your back to arch.  You should feel a gentle stretch in the right / left calf. Hold this position for __________ seconds. Repeat __________ times. Complete this stretch __________ times per day. STRETCH - Soleus, Standing   Place hands on wall.  Extend right / left leg, keeping the other knee somewhat bent.  Slightly point your toes inward on your back  foot.  Keep your right / left heel on the floor, bend your back knee, and slightly shift your weight over the back leg so that you feel a gentle stretch deep in your back calf.  Hold this position for __________ seconds. Repeat __________ times. Complete this stretch __________ times per day. STRETCH - Gastrocsoleus, Standing  Note: This exercise can place a lot of stress on your foot and ankle. Please complete this exercise only if specifically instructed by your caregiver.   Place the ball of your right / left foot on a step, keeping your other foot firmly on the same step.  Hold on to the wall or a rail for balance.  Slowly lift your other foot, allowing your body weight to press your heel down over the edge of the step.  You should feel a stretch in your right / left calf.  Hold this position for __________ seconds.  Repeat this exercise with a slight bend in your knee. Repeat __________ times. Complete this stretch __________ times per day.  STRENGTHENING EXERCISES - Achilles Tendinitis These exercises may help you when beginning to rehabilitate your injury. They may resolve your symptoms with or without further involvement from your physician, physical therapist or athletic trainer. While completing these exercises, remember:   Muscles can gain both the endurance and the strength needed for everyday activities through controlled exercises.  Complete these exercises as instructed by your physician, physical therapist or athletic trainer. Progress the resistance and  repetitions only as guided.  You may experience muscle soreness or fatigue, but the pain or discomfort you are trying to eliminate should never worsen during these exercises. If this pain does worsen, stop and make certain you are following the directions exactly. If the pain is still present after adjustments, discontinue the exercise until you can discuss the trouble with your clinician. STRENGTH - Plantar-flexors    Sit with your right / left leg extended. Holding onto both ends of a rubber exercise band/tubing, loop it around the ball of your foot. Keep a slight tension in the band.  Slowly push your toes away from you, pointing them downward.  Hold this position for __________ seconds. Return slowly, controlling the tension in the band/tubing. Repeat __________ times. Complete this exercise __________ times per day.  STRENGTH - Plantar-flexors   Stand with your feet shoulder width apart. Steady yourself with a wall or table using as little support as needed.  Keeping your weight evenly spread over the width of your feet, rise up on your toes.*  Hold this position for __________ seconds. Repeat __________ times. Complete this exercise __________ times per day.  *If this is too easy, shift your weight toward your right / left leg until you feel challenged. Ultimately, you may be asked to do this exercise with your right / left foot only. STRENGTH - Plantar-flexors, Eccentric  Note: This exercise can place a lot of stress on your foot and ankle. Please complete this exercise only if specifically instructed by your caregiver.   Place the balls of your feet on a step. With your hands, use only enough support from a wall or rail to keep your balance.  Keep your knees straight and rise up on your toes.  Slowly shift your weight entirely to your right / left toes and pick up your opposite foot. Gently and with controlled movement, lower your weight through your right / left foot so that your heel drops below the level of the step. You will feel a slight stretch in the back of your calf at the end position.  Use the healthy leg to help rise up onto the balls of both feet, then lower weight only on the right / left leg again. Build up to 15 repetitions. Then progress to 3 consecutive sets of 15 repetitions.*  After completing the above exercise, complete the same exercise with a slight knee bend (about 30  degrees). Again, build up to 15 repetitions. Then progress to 3 consecutive sets of 15 repetitions.* Perform this exercise __________ times per day.  *When you easily complete 3 sets of 15, your physician, physical therapist or athletic trainer may advise you to add resistance by wearing a backpack filled with additional weight. STRENGTH - Plantar Flexors, Seated   Sit on a chair that allows your feet to rest flat on the ground. If necessary, sit at the edge of the chair.  Keeping your toes firmly on the ground, lift your right / left heel as far as you can without increasing any discomfort in your ankle. Repeat __________ times. Complete this exercise __________ times a day. *If instructed by your physician, physical therapist or athletic trainer, you may add ____________________ of resistance by placing a weighted object on your right / left knee. Document Released: 04/07/2005 Document Revised: 11/29/2011 Document Reviewed: 12/19/2008 [facility] Patient Information 2015 [place], [place]. This information is not intended to replace advice given to you by your health care provider. Make sure you discuss any questions  you have with your health care provider.

## 2014-05-16 ENCOUNTER — Ambulatory Visit (INDEPENDENT_AMBULATORY_CARE_PROVIDER_SITE_OTHER): Payer: BC Managed Care – PPO | Admitting: Internal Medicine

## 2014-05-16 ENCOUNTER — Encounter: Payer: Self-pay | Admitting: Internal Medicine

## 2014-05-16 VITALS — BP 112/80 | HR 72 | Temp 98.2°F | Resp 16 | Ht 72.25 in | Wt 273.4 lb

## 2014-05-16 DIAGNOSIS — I10 Essential (primary) hypertension: Secondary | ICD-10-CM

## 2014-05-16 DIAGNOSIS — E559 Vitamin D deficiency, unspecified: Secondary | ICD-10-CM

## 2014-05-16 DIAGNOSIS — E782 Mixed hyperlipidemia: Secondary | ICD-10-CM

## 2014-05-16 DIAGNOSIS — R7303 Prediabetes: Secondary | ICD-10-CM | POA: Insufficient documentation

## 2014-05-16 DIAGNOSIS — E291 Testicular hypofunction: Secondary | ICD-10-CM

## 2014-05-16 DIAGNOSIS — M109 Gout, unspecified: Secondary | ICD-10-CM | POA: Insufficient documentation

## 2014-05-16 DIAGNOSIS — R7309 Other abnormal glucose: Secondary | ICD-10-CM

## 2014-05-16 DIAGNOSIS — Z79899 Other long term (current) drug therapy: Secondary | ICD-10-CM | POA: Insufficient documentation

## 2014-05-16 LAB — BASIC METABOLIC PANEL WITH GFR
BUN: 11 mg/dL (ref 6–23)
CHLORIDE: 105 meq/L (ref 96–112)
CO2: 26 mEq/L (ref 19–32)
CREATININE: 1.05 mg/dL (ref 0.50–1.35)
Calcium: 9.1 mg/dL (ref 8.4–10.5)
GFR, Est African American: >89 mL/min
GFR, Est Non African American: 85 mL/min
GLUCOSE: 90 mg/dL (ref 70–99)
Potassium: 4.2 mEq/L (ref 3.5–5.3)
Sodium: 141 mEq/L (ref 135–145)

## 2014-05-16 LAB — HEMOGLOBIN A1C
Hgb A1c MFr Bld: 6 % — ABNORMAL HIGH
Mean Plasma Glucose: 126 mg/dL — ABNORMAL HIGH

## 2014-05-16 LAB — HEPATIC FUNCTION PANEL
ALT: 30 U/L (ref 0–53)
AST: 20 U/L (ref 0–37)
Albumin: 4.4 g/dL (ref 3.5–5.2)
Alkaline Phosphatase: 46 U/L (ref 39–117)
Bilirubin, Direct: 0.1 mg/dL (ref 0.0–0.3)
Indirect Bilirubin: 0.5 mg/dL (ref 0.2–1.2)
Total Bilirubin: 0.6 mg/dL (ref 0.2–1.2)
Total Protein: 6.8 g/dL (ref 6.0–8.3)

## 2014-05-16 LAB — LIPID PANEL
Cholesterol: 165 mg/dL (ref 0–200)
HDL: 35 mg/dL — AB (ref >39)
LDL Cholesterol: 83 mg/dL (ref 0–99)
Total CHOL/HDL Ratio: 4.7 Ratio
Triglycerides: 236 mg/dL — ABNORMAL HIGH (ref <150)
VLDL: 47 mg/dL — ABNORMAL HIGH (ref 0–40)

## 2014-05-16 LAB — CBC WITH DIFFERENTIAL/PLATELET
Basophils Absolute: 0.1 K/uL (ref 0.0–0.1)
Basophils Relative: 1 % (ref 0–1)
Eosinophils Absolute: 0.2 K/uL (ref 0.0–0.7)
Eosinophils Relative: 3 % (ref 0–5)
HCT: 42 % (ref 39.0–52.0)
Hemoglobin: 15.1 g/dL (ref 13.0–17.0)
Lymphocytes Relative: 34 % (ref 12–46)
Lymphs Abs: 2.4 K/uL (ref 0.7–4.0)
MCH: 30.8 pg (ref 26.0–34.0)
MCHC: 36 g/dL (ref 30.0–36.0)
MCV: 85.5 fL (ref 78.0–100.0)
Monocytes Absolute: 0.5 K/uL (ref 0.1–1.0)
Monocytes Relative: 7 % (ref 3–12)
Neutro Abs: 3.9 K/uL (ref 1.7–7.7)
Neutrophils Relative %: 55 % (ref 43–77)
Platelets: 248 K/uL (ref 150–400)
RBC: 4.91 MIL/uL (ref 4.22–5.81)
RDW: 14 % (ref 11.5–15.5)
WBC: 7.1 K/uL (ref 4.0–10.5)

## 2014-05-16 LAB — TSH: TSH: 1.264 u[IU]/mL (ref 0.350–4.500)

## 2014-05-16 LAB — MAGNESIUM: Magnesium: 1.8 mg/dL (ref 1.5–2.5)

## 2014-05-16 LAB — URIC ACID: Uric Acid, Serum: 5.8 mg/dL (ref 4.0–7.8)

## 2014-05-16 NOTE — Progress Notes (Signed)
Patient ID: [name], male   DOB: 1966/04/09, 48 y.o.   MRN: [id number]   This very nice 48 y.o.male presents for 3 month follow up with Hypertension, Hyperlipidemia, Pre-Diabetes and Vitamin D Deficiency.    Patient is treated for HTN & BP has been controlled at home. Today's BP: 112/80 mmHg. Patient denies any cardiac type chest pain, palpitations, dyspnea/orthopnea/PND, dizziness, claudication, or dependent edema.   Hyperlipidemia is controlled with diet & meds. Patient denies myalgias or other med SE's. Last Lipids were  Chol 198; HDL  40; LDL  112*; Trig 231 on 01/08/2014.    Also, the patient has history of PreDiabetes and patient denies any symptoms of reactive hypoglycemia, diabetic polys, paresthesias or visual blurring.  Last A1c was  6.0% on  01/08/2014.     Further, Patient has history of Vitamin D Deficiency and patient supplements vitamin D without any suspected side-effects. Last vitamin D was  28 on 01/08/2014.   Medication List   aspirin 325 MG tablet  Take 325 mg by mouth daily.     AXIRON 30 MG/ACT Soln  Generic drug:  Testosterone  Place onto the skin daily.     citalopram 20 MG tablet  Commonly known as:  CELEXA  Take 1 tablet (20 mg total) by mouth daily.     colchicine 0.6 MG tablet  Take 1 tablet 1 to 2 x daily as directed for gout     divalproex 250 MG DR tablet  Commonly known as:  DEPAKOTE  TAKE 1 TABLET DAILY     febuxostat 40 MG tablet  Commonly known as:  ULORIC  Take 1 tablet (40 mg total) by mouth daily.     omeprazole 20 MG capsule  Commonly known as:  PRILOSEC  Take 20 mg by mouth daily.     rosuvastatin 40 MG tablet  Commonly known as:  CRESTOR  Take 1 tablet (40 mg total) by mouth daily.  Vitamin D 50,000 units  weekly     Allergies  Allergen Reactions  . Cymbalta [Duloxetine Hcl]   . Lexapro [Escitalopram]    PMHx:   Past Medical History  Diagnosis Date  . Community acquired pneumonia   . Hypercholesteremia   . Depression    . Shortness of breath   . Chest pain   . Pulmonary embolism     6/12  . GERD (gastroesophageal reflux disease)   . Palpitations   . Gout   . Hypogonadism male   . Vitamin D deficiency    FHx:    Reviewed / unchanged SHx:    Reviewed / unchanged  Systems Review:  Constitutional: Denies fever, chills, wt changes, headaches, insomnia, fatigue, night sweats, change in appetite. Eyes: Denies redness, blurred vision, diplopia, discharge, itchy, watery eyes.  ENT: Denies discharge, congestion, post nasal drip, epistaxis, sore throat, earache, hearing loss, dental pain, tinnitus, vertigo, sinus pain, snoring.  CV: Denies chest pain, palpitations, irregular heartbeat, syncope, dyspnea, diaphoresis, orthopnea, PND, claudication or edema. Respiratory: denies cough, dyspnea, DOE, pleurisy, hoarseness, laryngitis, wheezing.  Gastrointestinal: Denies dysphagia, odynophagia, heartburn, reflux, water brash, abdominal pain or cramps, nausea, vomiting, bloating, diarrhea, constipation, hematemesis, melena, hematochezia  or hemorrhoids. Genitourinary: Denies dysuria, frequency, urgency, nocturia, hesitancy, discharge, hematuria or flank pain. Musculoskeletal: Denies arthralgias, myalgias, stiffness, jt. swelling, pain, limping or strain/sprain.  Skin: Denies pruritus, rash, hives, warts, acne, eczema or change in skin lesion(s). Neuro: No weakness, tremor, incoordination, spasms, paresthesia or pain. Psychiatric: Denies confusion, memory loss or sensory loss.  Endo: Denies change in weight, skin or hair change.  Heme/Lymph: No excessive bleeding, bruising or enlarged lymph nodes.  Exam:  BP 112/80  Pulse 72  Temp 98.2 F   Resp 16  Ht 6' 0.25"   Wt 273 lb 6.4 oz   BMI 36.83 kg/m2  Appears well nourished and in no distress. Eyes: PERRLA, EOMs, conjunctiva no swelling or erythema. Sinuses: No frontal/maxillary tenderness ENT/Mouth: EAC's clear, TM's nl w/o erythema, bulging. Nares clear w/o  erythema, swelling, exudates. Oropharynx clear without erythema or exudates. Oral hygiene is good. Tongue normal, non obstructing. Hearing intact.  Neck: Supple. Thyroid nl. Car 2+/2+ without bruits, nodes or JVD. Chest: Respirations nl with BS clear & equal w/o rales, rhonchi, wheezing or stridor.  Cor: Heart sounds normal w/ regular rate and rhythm without sig. murmurs, gallops, clicks, or rubs. Peripheral pulses normal and equal  without edema.  Abdomen: Soft & bowel sounds normal. Non-tender w/o guarding, rebound, hernias, masses, or organomegaly.  Lymphatics: Unremarkable.  Musculoskeletal: Full ROM all peripheral extremities, joint stability, 5/5 strength, and normal gait.  Skin: Warm, dry without exposed rashes, lesions or ecchymosis apparent.  Neuro: Cranial nerves intact, reflexes equal bilaterally. Sensory-motor testing grossly intact. Tendon reflexes grossly intact.  Pysch: Alert & oriented x 3.  Insight and judgement nl & appropriate. No ideations.   Assessment and Plan:  1. Hypertension, Labile - Continue monitor blood pressure at home. Continue diet/meds same.  2. Hyperlipidemia - Continue diet/meds, exercise,& lifestyle modifications. Continue monitor periodic cholesterol/liver & renal functions   3. Pre-Diabetes - Continue diet, exercise, lifestyle modifications. Monitor appropriate labs.  4. Vitamin D Deficiency - Continue supplementation.  Recommended regular exercise, BP monitoring, weight control, and discussed med and SE's. Recommended labs to assess and monitor clinical status. Further disposition pending results of labs.

## 2014-05-16 NOTE — Patient Instructions (Signed)

## 2014-05-17 LAB — VITAMIN D 25 HYDROXY (VIT D DEFICIENCY, FRACTURES): Vit D, 25-Hydroxy: 43 ng/mL (ref 30–89)

## 2014-05-17 LAB — INSULIN, FASTING: Insulin fasting, serum: 10.4 u[IU]/mL (ref 2.0–19.6)

## 2014-05-31 ENCOUNTER — Ambulatory Visit (INDEPENDENT_AMBULATORY_CARE_PROVIDER_SITE_OTHER): Payer: BC Managed Care – PPO | Admitting: Podiatry

## 2014-05-31 ENCOUNTER — Encounter: Payer: Self-pay | Admitting: Podiatry

## 2014-05-31 VITALS — BP 117/73 | HR 89 | Resp 18

## 2014-05-31 DIAGNOSIS — M898X7 Other specified disorders of bone, ankle and foot: Secondary | ICD-10-CM

## 2014-05-31 DIAGNOSIS — M898X9 Other specified disorders of bone, unspecified site: Secondary | ICD-10-CM

## 2014-05-31 NOTE — Progress Notes (Signed)
   Subjective:    Patient ID: [name], male    DOB: October 13, 1965, 48 y.o.   MRN: [id number]  HPI "IT IS ABOUT THE SAME BUT IS NO WORSE ON MY ACHILLES TENDON ON MY LEFT FOOT AND THE NIGHT SPLINT MADE IT WORSE"  Mr. Faucett the office they for followup evaluation of left foot symptomatic retrocalcaneal exostosis. He states that at this time he continues to have pain over the same area on the lateral aspect of his heel. Pain is worse with shoe gear and pressure. At this time the patient states that he is tried all conservative treatment and is inquiring about surgical intervention. States that the night splint increases symptoms and he discontinued it. No other complaints at this time.   Review of Systems  All other systems reviewed and are negative.      Objective:   Physical Exam AAO x3, NAD DP/PT pulses palpable 2/4 b/l. CRT < 3sec Protective sensation intact with Dorann Ou monofilament, vibratory sensation intact, Achilles tendon reflex intact. Tenderness over the posterior lateral aspect of the left heel overlying a prominent retrocalcaneal exostosis, at the insertion of the Achilles tendon. Overlying skin intact. Today there is no pain along the course of the Achilles tendon. Achilles tendon appears to be intact. Equinus bilaterally. MMT 5/5, ROM WNL      Assessment & Plan:  48 year old male with symptomatic left lateral retrocalcaneal exostosis. -Conservative versus surgical treatment were discussed with the patient including alternatives, risks, complications. -At this time as his been ongoing without any resolution despite conservative treatment the patient is requesting surgical intervention to help decrease his pain. Risks of the surgery were discussed with the patient as well as the postoperative course. He is going to discuss this with his wife before making a decision. Also we will get a "for the cost of the surgery and call the patient. -Discussed that since he has a  history of pulmonary embolism he would have to be on Lovenox postoperatively while he remains nonweightbearing. -Patient will call the office once he makes a decision. He will need to come back in if he decides to proceed with the surgery for paperwork/consent. Call with any questions, concerns, change in symptoms.

## 2014-06-02 ENCOUNTER — Encounter: Payer: Self-pay | Admitting: Podiatry

## 2014-06-03 ENCOUNTER — Telehealth: Payer: Self-pay | Admitting: *Deleted

## 2014-06-03 NOTE — Telephone Encounter (Signed)
I'm a patient of Dr. Ardelle Anton.  We were talking about doing an ankle surgery for a bone spur.  I want to go ahead and schedule that for 06/24/2014.  He said he does Mondays so that's when I want to do it.  Please give me a call back and let me know if that works out.  Thanks a lot, bye.

## 2014-06-04 ENCOUNTER — Telehealth: Payer: Self-pay | Admitting: *Deleted

## 2014-06-04 NOTE — Telephone Encounter (Signed)
I called and left him a message to call and schedule a consultation with Dr. Ardelle Anton.   Then, we can get you scheduled for surgery.

## 2014-06-04 NOTE — Telephone Encounter (Signed)
I talked to Dr. Ardelle Anton about doing surgery on my ankle.  I want to go ahead and schedule that.  Please give me a call.    I had called and left a message for the patient to call and schedule a consultation with Dr. Ardelle Anton. 06/04/2014

## 2014-06-12 ENCOUNTER — Encounter: Payer: Self-pay | Admitting: Podiatry

## 2014-06-12 ENCOUNTER — Ambulatory Visit (INDEPENDENT_AMBULATORY_CARE_PROVIDER_SITE_OTHER): Payer: BC Managed Care – PPO | Admitting: Podiatry

## 2014-06-12 VITALS — BP 153/91 | HR 75 | Resp 16

## 2014-06-12 DIAGNOSIS — M79605 Pain in left leg: Secondary | ICD-10-CM

## 2014-06-12 DIAGNOSIS — M722 Plantar fascial fibromatosis: Secondary | ICD-10-CM

## 2014-06-12 DIAGNOSIS — M898X9 Other specified disorders of bone, unspecified site: Secondary | ICD-10-CM

## 2014-06-12 DIAGNOSIS — M898X7 Other specified disorders of bone, ankle and foot: Secondary | ICD-10-CM

## 2014-06-12 DIAGNOSIS — M79609 Pain in unspecified limb: Secondary | ICD-10-CM

## 2014-06-12 NOTE — Patient Instructions (Signed)
Pre-Operative Instructions  Congratulations, you have decided to take an important step to improving your quality of life.  You can be assured that the doctors of [facility] will be with you every step of the way.  1. Plan to be at the surgery center/hospital at least 1 (one) hour prior to your scheduled time unless otherwise directed by the surgical center/hospital staff.  You must have a responsible adult accompany you, remain during the surgery and drive you home.  Make sure you have directions to the surgical center/hospital and know how to get there on time. 2. For hospital based surgery you will need to obtain a history and physical form from your family physician within 1 month prior to the date of surgery- we will give you a form for you primary physician.  3. We make every effort to accommodate the date you request for surgery.  There are however, times where surgery dates or times have to be moved.  We will contact you as soon as possible if a change in schedule is required.   4. No Aspirin/Ibuprofen for one week before surgery.  If you are on aspirin, any non-steroidal anti-inflammatory medications (Mobic, Aleve, Ibuprofen) you should stop taking it 7 days prior to your surgery.  You make take Tylenol  For pain prior to surgery.  5. Medications- If you are taking daily heart and blood pressure medications, seizure, reflux, allergy, asthma, anxiety, pain or diabetes medications, make sure the surgery center/hospital is aware before the day of surgery so they may notify you which medications to take or avoid the day of surgery. 6. No food or drink after midnight the night before surgery unless directed otherwise by surgical center/hospital staff. 7. No alcoholic beverages 24 hours prior to surgery.  No smoking 24 hours prior to or 24 hours after surgery. 8. Wear loose pants or shorts- loose enough to fit over bandages, boots, and casts. 9. No slip on shoes, sneakers are best. 10. Bring  your boot with you to the surgery center/hospital.  Also bring crutches or a walker if your physician has prescribed it for you.  If you do not have this equipment, it will be provided for you after surgery. 11. If you have not been contracted by the surgery center/hospital by the day before your surgery, call to confirm the date and time of your surgery. 12. Leave-time from work may vary depending on the type of surgery you have.  Appropriate arrangements should be made prior to surgery with your employer. 13. Prescriptions will be provided immediately following surgery by your doctor.  Have these filled as soon as possible after surgery and take the medication as directed. 14. Remove nail polish on the operative foot. 15. Wash the night before surgery.  The night before surgery wash the foot and leg well with the antibacterial soap provided and water paying special attention to beneath the toenails and in between the toes.  Rinse thoroughly with water and dry well with a towel.  Perform this wash unless told not to do so by your physician.  Enclosed: 1 Ice pack (please put in freezer the night before surgery)   1 Hibiclens skin cleaner   Pre-op Instructions  If you have any questions regarding the instructions, do not hesitate to call our office.  [place]: [address] [place], [place] [address] [phone]  [place]: [address] [place], [place] [address] [phone]  [place]: [address]  [place], [place] [address] [phone]  Dr. Richard   Tuchman DPM, Dr. Norman Regal DPM Dr. Richard Sikora DPM, Dr. M. Todd Hyatt DPM, Dr. Kathryn Egerton DPM 

## 2014-06-13 ENCOUNTER — Encounter: Payer: Self-pay | Admitting: Podiatry

## 2014-06-13 NOTE — Progress Notes (Signed)
Patient ID: [name], male   DOB: [birth year], 48 y.o.   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 Patient states it is his been ongoing for several months and he is tried multiple conservative therapies. He has tried stretching, night splints, anti-inflammatory medications, shoe gear changes without any resolution of his symptoms. At this time the patient has ongoing symptoms and is requesting surgical intervention to help decrease his pain. Also today he states that he has intermittent heel pain which has been ongoing for several weeks. He is tried various treatments for this as well. Denies any history of trauma or injury to the area. No other complaints at this time.  Objective: AAO x3, in no acute distress DP/PT pulses palpable bilaterally, CRT less than 3 seconds, +pedal hair Protective sensation intact with Simms Weinstein monofilament, vibratory sensation intact, Achilles tendon reflex intact. Prominent exostosis over the posterior lateral aspect of the heel with tenderness on palpation over this area. Mild tenderness along the distal insertion of the Achilles tendon onto the calcaneus. Mild equinus. No pain with lateral compression of the calcaneus. Mild discomfort along the plantar medial tubercle of the calcaneus at the insertion of the plantar fascia. No pain along the course of the plantar fascia within the arch of the foot. MMT 5/5, ROM WNL No open lesions. No calf pain, swelling, warmth.  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 -Conservative versus surgical treatment were discussed in detail including alternatives, risks, complications. -Discussed possible steroid injection into the heel for plantar fasciitis. At  this time patient wishes to hold off and will likely injection to attempt surgery. -At this time patient is requesting surgical intervention to help decrease his pain and deformity for the retrocalcaneal exostosis and Achilles pain. The procedure which included attachment with reattachment of the Achilles tendon, exostectomy , plantar fascial injection were discussed. Postoperative course discussed in detail. Alternatives, risks, complications were discussed in detail. Risks of the surgery were discussed which include, but not limited to, infection, bleeding, pain, swelling, need for further surgery, delayed or nonhealing, numbness or sensation changes, painful or ugly scar, reoccurrence, Achilles tendon rupture/plantar fascia rupture, blood clots, hardware failure. Patient understands the risks of surgery and also understands that given his history of pulmonary embolism and other medical conditions does increase his risk of complications. Patient understands and wishes to proceed. The surgical consent was reviewed with the patient for which she signed.  -Surgery will be held Monday, October 5 at [facility]. -Due to a history of pulmonary embolism we'll prophylax with Lovenox 40 mg daily until weightbearing. -Patient directed to call the office with any questions, concerns, change in symptoms prior to surgery. -For now continue stretching, icing. -Will followup 5 days status post surgery on Friday, June 28, 2014.

## 2014-06-21 ENCOUNTER — Telehealth: Payer: Self-pay | Admitting: *Deleted

## 2014-06-21 MED ORDER — ENOXAPARIN SODIUM 40 MG/0.4ML ~~LOC~~ SOLN: 40.0000 mg | SUBCUTANEOUS | Status: DC

## 2014-06-21 NOTE — Telephone Encounter (Signed)
I called and left him a message on his home phone then called him on his mobile.  I informed him that Dr. Ardelle AntonWagoner wants you to use Lovenox after the surgery.  I e-scribed the prescription into [facility] on [address].  I asked if he knew how to use them.  He stated he had used it before.  I informed him that he wants him to start using it on Tuesday.

## 2014-06-24 DIAGNOSIS — M7732 Calcaneal spur, left foot: Secondary | ICD-10-CM

## 2014-06-28 ENCOUNTER — Ambulatory Visit (INDEPENDENT_AMBULATORY_CARE_PROVIDER_SITE_OTHER): Payer: BC Managed Care – PPO

## 2014-06-28 ENCOUNTER — Ambulatory Visit (INDEPENDENT_AMBULATORY_CARE_PROVIDER_SITE_OTHER): Payer: BC Managed Care – PPO | Admitting: Podiatry

## 2014-06-28 ENCOUNTER — Encounter: Payer: Self-pay | Admitting: Podiatry

## 2014-06-28 VITALS — BP 139/92 | HR 99 | Resp 16

## 2014-06-28 DIAGNOSIS — M2570 Osteophyte, unspecified joint: Secondary | ICD-10-CM

## 2014-06-28 DIAGNOSIS — Z9889 Other specified postprocedural states: Secondary | ICD-10-CM

## 2014-06-28 DIAGNOSIS — M898X7 Other specified disorders of bone, ankle and foot: Secondary | ICD-10-CM

## 2014-06-28 NOTE — Progress Notes (Signed)
   Subjective:    Patient ID: [name], male    DOB: 05/16/1966, 48 y.o.   MRN: [id number]  HPI Comments: DOS 06/24/2014 left calcaneal osteotomy posterior.  [name] presents the office today 4 days status post left retrocalcaneal exostectomy with reattachment and reattachment of Achilles tendon. Patient states that he is doing well with mild pain. He's been taking Percocet as needed for pain. He is also been continuing Lovenox. He has been taking Keflex. Patient does state that while his foot was now he was putting his left foot on the ground without realizing it. Denies any systemic complaints such as fevers, chills, nausea, vomiting. No shortness of breath. No other complaints at this time. No acute changes since last appointment.     Review of Systems     Objective:   Physical Exam AAO x3, NAD DP/PT pulses palpable bilaterally, CRT less than 3 seconds. Protective sensation intact. Incision along the left Achilles tendon intact with sutures without any evidence of dehiscence and is well coapted. There is no drainage identified. Slight. Incisional erythema from inflammation without any ascending cellulitis or increased warmth over the area. Mild tenderness over the incision site which is normal for this time post-operatively. Thompson test performed, Achilles tendon intact.  No calf pain with compression. No warmth over the calf or increased swelling.       Assessment & Plan:  48 year old male 4 days status post left retrocalcaneal exostectomy, healing appropriately for this time frame. -X-rays were obtained and reviewed with the patient. -Cast was removed for exam. -[place] compression dressing and fiberglass shell were applied. -Continue Percocet, Keflex, Lovenox. -Continue ice and elevation. -Monitor for any signs or symptoms of infection and directed to call the office immediately if any are to occur. Also monitor for any signs or symptoms of DVT, PE. If any are to  occur to go directly to the emergency room. -Followup in 2 weeks for likely suture removal. Again discussed the postoperative course which involves nonweightbearing in a cast for approximately 4 weeks. After sutures are removed and the incision is healed we'll advance casting to a neutral position. After 4 weeks we'll likely transition into a cam boot with gradual weightbearing as tolerated likely with heel lifts. After. Time we'll then transition into his shoe. Possible physical therapy if needed

## 2014-06-30 ENCOUNTER — Encounter: Payer: Self-pay | Admitting: Podiatry

## 2014-07-01 ENCOUNTER — Ambulatory Visit: Payer: Self-pay

## 2014-07-03 ENCOUNTER — Ambulatory Visit (INDEPENDENT_AMBULATORY_CARE_PROVIDER_SITE_OTHER): Payer: BC Managed Care – PPO | Admitting: *Deleted

## 2014-07-03 DIAGNOSIS — Z23 Encounter for immunization: Secondary | ICD-10-CM

## 2014-07-10 ENCOUNTER — Ambulatory Visit (INDEPENDENT_AMBULATORY_CARE_PROVIDER_SITE_OTHER): Payer: BC Managed Care – PPO | Admitting: Podiatry

## 2014-07-10 DIAGNOSIS — Z9889 Other specified postprocedural states: Secondary | ICD-10-CM

## 2014-07-14 NOTE — Progress Notes (Signed)
Patient ID: [name], male   DOB: 03/04/1966, 48 y.o.   MRN: [id number]  DOS: 06/24/14, POV #2  Subjective: [name] today follow-up evaluation status post left retrocalcaneal exostectomy with detachment and reattachment of Achilles tendon. Patient has been continuing nonweightbearing. He states that his pain is controlled and he has decrease his need for pain medication. He last been continuing with Lovenox. He denies any systemic complaints of fevers, chills, nausea, vomiting. States that he is feeling good and he states it feels like "he can start walking on it". He denies any pain in his calf. No other complaints at this time.  Objective: AAO x3, NAD DP/PT pulses palpable bilaterally, CRT less than 3 seconds Protective sensation intact with Dorann OuSimms Weinstein monofilament Incisional posterior aspect of Achilles tendon well coapted without any evidence of dehiscence and sutures intact. No surrounding erythema, ascending cellulitis, drainage. There is mild ecchymosis. Mild edema. No tenderness over the surgical site. Pain with compression, warmth, erythema.  Assessment: 48 year old male status post left retrocalcaneal exostectomy.  Plan: -Treatment options discussed including alternatives, risks, complications. -Every other suture was removed at this time. Incision is well coapted. We'll remove remaining stitches at next appointment. -At this time the patient was casted at a neutral position. -Continue nonweightbearing. -Continue ice and elevation. -Continue Lovenox until weightbearing. -Follow-up in 1 week or sooner if any problems are to arise or any change in symptoms. In the meantime call the office with any questions, concerns, change in symptoms. Monitoring clinical signs or symptoms of infection and directed to call the office immediately if any are to occur ago directly to the emergency room.

## 2014-07-19 ENCOUNTER — Encounter: Payer: Self-pay | Admitting: Podiatry

## 2014-07-19 ENCOUNTER — Ambulatory Visit (INDEPENDENT_AMBULATORY_CARE_PROVIDER_SITE_OTHER): Payer: BC Managed Care – PPO | Admitting: Podiatry

## 2014-07-19 VITALS — BP 143/90 | HR 81 | Resp 16

## 2014-07-19 DIAGNOSIS — Z9889 Other specified postprocedural states: Secondary | ICD-10-CM

## 2014-07-19 DIAGNOSIS — M898X7 Other specified disorders of bone, ankle and foot: Secondary | ICD-10-CM

## 2014-07-19 DIAGNOSIS — M2570 Osteophyte, unspecified joint: Secondary | ICD-10-CM

## 2014-07-22 ENCOUNTER — Encounter: Payer: Self-pay | Admitting: Podiatry

## 2014-07-22 NOTE — Progress Notes (Signed)
Patient ID: Cathie BeamsScott G Laux, male   DOB: 08/01/1966, 48 y.o.   MRN: [id number]  DOS: 06/24/14 POV #3  Subjective: Mr. Evern CoreGaliger returns to the office today follow-up evaluation status post left retrocalcaneal exostectomy with detachment and reattachment of Achilles tendon. He has been continuing nonweightbearing. He is also been continuing with his Lovenox without complications. No longer requiring any pain medication. Denies any acute changes his last appointment. Denies any systemic complaints of fevers, chills, nausea, vomiting, SOB, chest pain. No other complaints at this time.  Objective: AAO x3, NAD DP/PT pulses palpable bilaterally, CRT less than 3 seconds Protective sensation intact with Simms Weinstein monofilament, vibratory sensation intact, Achilles tendon reflex intact Remaining sutures intact. Incision is well coapted without any evidence of dehiscence. There is no surrounding erythema, drainage, ascending cellulitis, fluctuance, crepitus. Mild amount of ecchymosis. Thompson test was performed and the Achilles tendon appears to be intact. No significant tenderness overlying the surgical site. Mild edema. There is no calf pain with compression, warmth, erythema.   Assessment: 48 year old male status post left retrocalcaneal exostectomy.  Plan: -Treatment options discussed including alternatives, risks, complications. -Remaining sutures removed without complications. Incision is well coapted without any evidence of dehiscence.  -Patient was placed into a CAM boot with heel lifts. Remain nonweightbearing for 1 more week.  -Continue with Lovenox. -Ice and elevation. -Continue to monitor for any clinical signs or symptoms of DVT/PE. It goes directly to the emergency room if any are to occur. -Follow-up in 1 week or sooner if any problems are to arise or any change in symptoms. In the meantime call the office in a questions, concerns.

## 2014-07-26 ENCOUNTER — Ambulatory Visit (INDEPENDENT_AMBULATORY_CARE_PROVIDER_SITE_OTHER): Payer: BC Managed Care – PPO

## 2014-07-26 ENCOUNTER — Encounter: Payer: Self-pay | Admitting: Podiatry

## 2014-07-26 ENCOUNTER — Ambulatory Visit (INDEPENDENT_AMBULATORY_CARE_PROVIDER_SITE_OTHER): Payer: BC Managed Care – PPO | Admitting: Podiatry

## 2014-07-26 VITALS — BP 158/92 | HR 80 | Resp 14

## 2014-07-26 DIAGNOSIS — Z9889 Other specified postprocedural states: Secondary | ICD-10-CM

## 2014-07-26 NOTE — Progress Notes (Signed)
Patient ID: [name], male   DOB: 06/14/1966, 48 y.o.   MRN: [id number]  Subjective: 48 year old male, presents to the office today for follow up evaluation status post left retrocalcaneal exostectomy with detachment and reattachment of Achilles tendon. He states that since last appointment he is started to weight-bear in the CAM boot, although we had discussed to remain NWB. He states that he has a slight increase in pain since he started to transition to weightbearing although it does not require pain medication. He states the swelling has decreased significantly since last appointment. Denies any systemic complaints such as fevers, chills, nausea, vomiting. No acute changes. No other complaints at this time.  Objective: AAO x3, NAD DP/PT pulses palpable bilaterally, CRT less than 3 seconds Protective sensation intact with Simms Weinstein monofilament, vibratory sensation intact, Achilles tendon reflex intact Incision on the posterior aspect of the left leg along the Achilles tendon is well coapted without any evidence of dehiscence. There is no surrounding erythema, drainage, increased warmth. Thompson test was performed and the Achilles tendon is intact. Mild tenderness over the surgical site along the insertion into the calcaneus on the posterior aspect of the calcaneous. No calf pain with compression, swelling, warmth, erythema. No open lesions.  Assessment: 48 year old male postop visit number for status post left retrocalcaneal exostectomy date of surgery 06/24/2014.  Plan: -Treatment options discussed including alternatives, risks, competitions. -At this time the patient can transition into partial weightbearing. I discussed with him that he should not put full weight on the foot at this time and to continue to use the crutches for assistance. He did not have crutches with him at today's appointment as he has been full WB in a CAM boot. Discussed with him to take it easy and not to  transition so quickly as he is at risk for rupturing the tendon. -Can start gradual range of motion exercises for which we discussed. Discussed that if there is increased pain to stop. -Continue ice and elevation. -At this time he has finished his course of Lovenox. Continue to monitor for any signs or symptoms of DVT and pulmonary embolism. Discussed to go directly to the emergency room if any are to occur. -Follow-up in 2 weeks or sooner if any problems are to arise. In the meantime call the office with any questions, concerns, change in symptoms.

## 2014-08-09 ENCOUNTER — Ambulatory Visit (INDEPENDENT_AMBULATORY_CARE_PROVIDER_SITE_OTHER): Payer: BC Managed Care – PPO | Admitting: Podiatry

## 2014-08-09 DIAGNOSIS — Z9889 Other specified postprocedural states: Secondary | ICD-10-CM

## 2014-08-09 NOTE — Patient Instructions (Signed)
You can SLOWLY transition into a regular shoe with a heel lift as we discussed. If you have any increased pain, call me.

## 2014-08-09 NOTE — Progress Notes (Signed)
Patient ID: Cathie BeamsScott G Gallien, male   DOB: 09/06/1966, 48 y.o.   MRN: [id number]  Subjective: 48 year old male returns the office today for follow-up evaluation status post left retrocalcaneal exostectomy with reattachment of Achilles tendon. He states he is been continuing with the CAM boot at all times but he has been pretty active. He denies any acute changes his last appointment. Chronic denies any pain to the area although he does have some intermittently. No fevers, chills, nausea, vomiting. No other complaints at this time.  Objective: AAO 3, NAD DP/PT pulses palpable bilaterally, CRT less than 3 seconds Protective sensation intact Simms Weinstein monofilament Incision on the posterior aspect of the left leg on the distal aspect of the Achilles tendon is well coapted without any evidence of dehiscence is healed at this time. There is no surrounding erythema, increase in warmth. There is mild edema to the left distal leg. There is no edema to the calf. There is no defect noted within the Achilles tendon and the Achilles tendon appears to be intact. There is mild tenderness at the insertion of the Achilles tendon on the posterior calcaneus at the surgical site. No conical signs of infection. There is no pain with calf compression, swelling, warmth, erythema. No open lesions or pre-ulcerative lesions.  Assessment: 48 year old male postop visit number for status post left retrocalcaneal exostectomy with reattachment of Achilles tendon.  Plan: -Treatment options were discussed including alternatives, risks, complications. -At this time discussed with the patient that he can SLOWLY transition back into a regular sneaker with a heel lift over the next couple weeks. I discussed with him to do this progressively and not overdo it. Discussed that if he has any increase in pain to the area to go back into the boot and call me. Discussed with him that he is still at risk for rupture the tendon or tearing of  the tendon. -Continue ice to the area. -Follow-up in 2 weeks. In the meantime, call the office with any questions, concerns, change in symptoms.

## 2014-08-11 ENCOUNTER — Other Ambulatory Visit: Payer: Self-pay | Admitting: Emergency Medicine

## 2014-08-11 MED ORDER — SCOPOLAMINE 1 MG/3DAYS TD PT72: 1.0000 | MEDICATED_PATCH | TRANSDERMAL | Status: DC

## 2014-08-21 ENCOUNTER — Encounter: Payer: Self-pay | Admitting: Podiatry

## 2014-08-21 ENCOUNTER — Ambulatory Visit (INDEPENDENT_AMBULATORY_CARE_PROVIDER_SITE_OTHER): Payer: BC Managed Care – PPO | Admitting: Podiatry

## 2014-08-21 VITALS — BP 143/98 | HR 73 | Resp 18

## 2014-08-21 DIAGNOSIS — Z9889 Other specified postprocedural states: Secondary | ICD-10-CM

## 2014-08-21 NOTE — Progress Notes (Signed)
Patient ID: [name], male   DOB: 09/21/1965, 48 y.o.   MRN: [id number]  Subjective: 48 year old male returns the office they for follow-up evaluation status post left retrocalcaneal exostectomy and reattachment of Achilles tendon. He has continued back into a regular shoe. He states he hasn't intermittent discomfort. He does have some mild swelling to the foot and ankle. He denies any pain or swelling or any redness/warmth the calf or any shortness of breath. No acute changes since last appointment. Denies any systemic complaints as fevers, chills, nausea, vomiting. No other complaints at this time.  Objective: AAO x3, NAD DP/PT pulses palpable bilaterally, CRT less than 3 seconds Protective sensation intact with Simms Weinstein monofilament, vibratory sensation intact, Achilles tendon reflex intact Incision on the posterior aspect of the left leg at the distal aspect of the Achilles tendon as well coapted without any evidence of dehiscence and is well healed at this time. There is mild tenderness to palpation overlying the posterior aspect of the calcaneus at the insertion of the Achilles tendon. There is no pain along the course of the Achilles tendon and a Thompson test was performed and the Achilles tendon is intact. There is no overlying erythema or increase in warmth. There is mild edema to the foot and ankle. There is no pain with calf compression, swelling, warmth, erythema. No open lesions or pre-ulcerative lesions.  Assessment: 48 year old male 2 months status post left retrocalcaneal exostectomy with reattachment of Achilles tendon  Plan: -Treatment options were discussed including alternatives, risks, complications. -Continue with regular shoe gear. -Discussed various stretching exercises to help stretch/strengthening the Achilles tendon. Discussed with him that if he has any increasing symptoms while performing these activities due to decreased activity. -Continue ice and  elevation. -Dispensed Ace bandage as well as anklet to help control swelling. -Monitor for any signs or symptoms of DVT/PE and directed to the emergency room if any are to occur. -Follow-up in one month or sooner if any palms are to arise. In the meantime, call the office with any questions, concerns, change in symptoms.

## 2014-08-21 NOTE — Patient Instructions (Signed)
Achilles Tendinitis   with Rehab  Achilles tendinitis is a disorder of the Achilles tendon. The Achilles tendon connects the large calf muscles (Gastrocnemius and Soleus) to the heel bone (calcaneus). This tendon is sometimes called the heel cord. It is important for pushing-off and standing on your toes and is important for walking, running, or jumping. Tendinitis is often caused by overuse and repetitive microtrauma.  SYMPTOMS  · Pain, tenderness, swelling, warmth, and redness may occur over the Achilles tendon even at rest.  · Pain with pushing off, or flexing or extending the ankle.  · Pain that is worsened after or during activity.  CAUSES   · Overuse sometimes seen with rapid increase in exercise programs or in sports requiring running and jumping.  · Poor physical conditioning (strength and flexibility or endurance).  · Running sports, especially training running down hills.  · Inadequate warm-up before practice or play or failure to stretch before participation.  · Injury to the tendon.  PREVENTION   · Warm up and stretch before practice or competition.  · Allow time for adequate rest and recovery between practices and competition.  · Keep up conditioning.  ¨ Keep up ankle and leg flexibility.  ¨ Improve or keep muscle strength and endurance.  ¨ Improve cardiovascular fitness.  · Use proper technique.  · Use proper equipment (shoes, skates).  · To help prevent recurrence, taping, protective strapping, or an adhesive bandage may be recommended for several weeks after healing is complete.  PROGNOSIS   · Recovery may take weeks to several months to heal.  · Longer recovery is expected if symptoms have been prolonged.  · Recovery is usually quicker if the inflammation is due to a direct blow as compared with overuse or sudden strain.  RELATED COMPLICATIONS   · Healing time will be prolonged if the condition is not correctly treated. The injury must be given plenty of time to heal.  · Symptoms can reoccur if  activity is resumed too soon.  · Untreated, tendinitis may increase the risk of tendon rupture requiring additional time for recovery and possibly surgery.  TREATMENT   · The first treatment consists of rest anti-inflammatory medication, and ice to relieve the pain.  · Stretching and strengthening exercises after resolution of pain will likely help reduce the risk of recurrence. Referral to a physical therapist or athletic trainer for further evaluation and treatment may be helpful.  · A walking boot or cast may be recommended to rest the Achilles tendon. This can help break the cycle of inflammation and microtrauma.  · Arch supports (orthotics) may be prescribed or recommended by your caregiver as an adjunct to therapy and rest.  · Surgery to remove the inflamed tendon lining or degenerated tendon tissue is rarely necessary and has shown less than predictable results.  MEDICATION   · Nonsteroidal anti-inflammatory medications, such as aspirin and ibuprofen, may be used for pain and inflammation relief. Do not take within 7 days before surgery. Take these as directed by your caregiver. Contact your caregiver immediately if any bleeding, stomach upset, or signs of allergic reaction occur. Other minor pain relievers, such as acetaminophen, may also be used.  · Pain relievers may be prescribed as necessary by your caregiver. Do not take prescription pain medication for longer than 4 to 7 days. Use only as directed and only as much as you need.  · Cortisone injections are rarely indicated. Cortisone injections may weaken tendons and predispose to rupture. It is better   to give the condition more time to heal than to use them.  HEAT AND COLD  · Cold is used to relieve pain and reduce inflammation for acute and chronic Achilles tendinitis. Cold should be applied for 10 to 15 minutes every 2 to 3 hours for inflammation and pain and immediately after any activity that aggravates your symptoms. Use ice packs or an ice  massage.  · Heat may be used before performing stretching and strengthening activities prescribed by your caregiver. Use a heat pack or a warm soak.  SEEK MEDICAL CARE IF:  · Symptoms get worse or do not improve in 2 weeks despite treatment.  · New, unexplained symptoms develop. Drugs used in treatment may produce side effects.  EXERCISES  RANGE OF MOTION (ROM) AND STRETCHING EXERCISES - Achilles Tendinitis   These exercises may help you when beginning to rehabilitate your injury. Your symptoms may resolve with or without further involvement from your physician, physical therapist or athletic trainer. While completing these exercises, remember:   · Restoring tissue flexibility helps normal motion to return to the joints. This allows healthier, less painful movement and activity.  · An effective stretch should be held for at least 30 seconds.  · A stretch should never be painful. You should only feel a gentle lengthening or release in the stretched tissue.  STRETCH - Gastroc, Standing   · Place hands on wall.  · Extend right / left leg, keeping the front knee somewhat bent.  · Slightly point your toes inward on your back foot.  · Keeping your right / left heel on the floor and your knee straight, shift your weight toward the wall, not allowing your back to arch.  · You should feel a gentle stretch in the right / left calf. Hold this position for __________ seconds.  Repeat __________ times. Complete this stretch __________ times per day.  STRETCH - Soleus, Standing   · Place hands on wall.  · Extend right / left leg, keeping the other knee somewhat bent.  · Slightly point your toes inward on your back foot.  · Keep your right / left heel on the floor, bend your back knee, and slightly shift your weight over the back leg so that you feel a gentle stretch deep in your back calf.  · Hold this position for __________ seconds.  Repeat __________ times. Complete this stretch __________ times per day.  STRETCH -  Gastrocsoleus, Standing   Note: This exercise can place a lot of stress on your foot and ankle. Please complete this exercise only if specifically instructed by your caregiver.   · Place the ball of your right / left foot on a step, keeping your other foot firmly on the same step.  · Hold on to the wall or a rail for balance.  · Slowly lift your other foot, allowing your body weight to press your heel down over the edge of the step.  · You should feel a stretch in your right / left calf.  · Hold this position for __________ seconds.  · Repeat this exercise with a slight bend in your knee.  Repeat __________ times. Complete this stretch __________ times per day.   STRENGTHENING EXERCISES - Achilles Tendinitis  These exercises may help you when beginning to rehabilitate your injury. They may resolve your symptoms with or without further involvement from your physician, physical therapist or athletic trainer. While completing these exercises, remember:   · Muscles can gain both the endurance   and the strength needed for everyday activities through controlled exercises.  · Complete these exercises as instructed by your physician, physical therapist or athletic trainer. Progress the resistance and repetitions only as guided.  · You may experience muscle soreness or fatigue, but the pain or discomfort you are trying to eliminate should never worsen during these exercises. If this pain does worsen, stop and make certain you are following the directions exactly. If the pain is still present after adjustments, discontinue the exercise until you can discuss the trouble with your clinician.  STRENGTH - Plantar-flexors   · Sit with your right / left leg extended. Holding onto both ends of a rubber exercise band/tubing, loop it around the ball of your foot. Keep a slight tension in the band.  · Slowly push your toes away from you, pointing them downward.  · Hold this position for __________ seconds. Return slowly, controlling the  tension in the band/tubing.  Repeat __________ times. Complete this exercise __________ times per day.   STRENGTH - Plantar-flexors   · Stand with your feet shoulder width apart. Steady yourself with a wall or table using as little support as needed.  · Keeping your weight evenly spread over the width of your feet, rise up on your toes.*  · Hold this position for __________ seconds.  Repeat __________ times. Complete this exercise __________ times per day.   *If this is too easy, shift your weight toward your right / left leg until you feel challenged. Ultimately, you may be asked to do this exercise with your right / left foot only.  STRENGTH - Plantar-flexors, Eccentric   Note: This exercise can place a lot of stress on your foot and ankle. Please complete this exercise only if specifically instructed by your caregiver.   · Place the balls of your feet on a step. With your hands, use only enough support from a wall or rail to keep your balance.  · Keep your knees straight and rise up on your toes.  · Slowly shift your weight entirely to your right / left toes and pick up your opposite foot. Gently and with controlled movement, lower your weight through your right / left foot so that your heel drops below the level of the step. You will feel a slight stretch in the back of your calf at the end position.  · Use the healthy leg to help rise up onto the balls of both feet, then lower weight only on the right / left leg again. Build up to 15 repetitions. Then progress to 3 consecutive sets of 15 repetitions.*  · After completing the above exercise, complete the same exercise with a slight knee bend (about 30 degrees). Again, build up to 15 repetitions. Then progress to 3 consecutive sets of 15 repetitions.*  Perform this exercise __________ times per day.   *When you easily complete 3 sets of 15, your physician, physical therapist or athletic trainer may advise you to add resistance by wearing a backpack filled with  additional weight.  STRENGTH - Plantar Flexors, Seated   · Sit on a chair that allows your feet to rest flat on the ground. If necessary, sit at the edge of the chair.  · Keeping your toes firmly on the ground, lift your right / left heel as far as you can without increasing any discomfort in your ankle.  Repeat __________ times. Complete this exercise __________ times a day.  *If instructed by your physician, physical therapist or athletic   trainer, you may add ____________________ of resistance by placing a weighted object on your right / left knee.  Document Released: 04/07/2005 Document Revised: 11/29/2011 Document Reviewed: 12/19/2008  Exit[employer]® Patient Information ©2015 [employer]. This information is not intended to replace advice given to you by your health care provider. Make sure you discuss any questions you have with your health care provider.

## 2014-08-26 ENCOUNTER — Other Ambulatory Visit: Payer: Self-pay | Admitting: *Deleted

## 2014-08-26 MED ORDER — COLCHICINE 0.6 MG PO TABS: ORAL_TABLET | ORAL | Status: DC

## 2014-09-09 ENCOUNTER — Ambulatory Visit (INDEPENDENT_AMBULATORY_CARE_PROVIDER_SITE_OTHER): Payer: BC Managed Care – PPO | Admitting: Physician Assistant

## 2014-09-09 ENCOUNTER — Encounter: Payer: Self-pay | Admitting: Physician Assistant

## 2014-09-09 VITALS — BP 132/90 | HR 78 | Temp 98.4°F | Resp 18 | Ht 72.25 in | Wt 279.0 lb

## 2014-09-09 DIAGNOSIS — L739 Follicular disorder, unspecified: Principal | ICD-10-CM

## 2014-09-09 DIAGNOSIS — L738 Other specified follicular disorders: Secondary | ICD-10-CM

## 2014-09-09 DIAGNOSIS — L0102 Bockhart's impetigo: Secondary | ICD-10-CM

## 2014-09-09 MED ORDER — DOXYCYCLINE HYCLATE 100 MG PO TABS: 100.0000 mg | ORAL_TABLET | Freq: Two times a day (BID) | ORAL | Status: DC

## 2014-09-09 NOTE — Patient Instructions (Signed)
-  Take doxycycline as prescribed with food.   -Can take baths with 1/2 cup of Clorox in tub and soak until you can not tolerated smell of chlorox anymore.   If you are not feeling better in 10-14 days, then please call the office.  Folliculitis  Folliculitis is redness, soreness, and swelling (inflammation) of the hair follicles. This condition can occur anywhere on the body. People with weakened immune systems, diabetes, or obesity have a greater risk of getting folliculitis. CAUSES  Bacterial infection. This is the most common cause.  Fungal infection.  Viral infection.  Contact with certain chemicals, especially oils and tars. Long-term folliculitis can result from bacteria that live in the nostrils. The bacteria may trigger multiple outbreaks of folliculitis over time. SYMPTOMS Folliculitis most commonly occurs on the scalp, thighs, legs, back, buttocks, and areas where hair is shaved frequently. An early sign of folliculitis is a small, white or yellow, pus-filled, itchy lesion (pustule). These lesions appear on a red, inflamed follicle. They are usually less than 0.2 inches (5 mm) wide. When there is an infection of the follicle that goes deeper, it becomes a boil or furuncle. A group of closely packed boils creates a larger lesion (carbuncle). Carbuncles tend to occur in hairy, sweaty areas of the body. DIAGNOSIS  Your caregiver can usually tell what is wrong by doing a physical exam. A sample may be taken from one of the lesions and tested in a lab. This can help determine what is causing your folliculitis. TREATMENT  Treatment may include:  Applying warm compresses to the affected areas.  Taking antibiotic medicines orally or applying them to the skin.  Draining the lesions if they contain a large amount of pus or fluid.  Laser hair removal for cases of long-lasting folliculitis. This helps to prevent regrowth of the hair. HOME CARE INSTRUCTIONS  Apply warm compresses to the  affected areas as directed by your caregiver.  If antibiotics are prescribed, take them as directed. Finish them even if you start to feel better.  You may take over-the-counter medicines to relieve itching.  Do not shave irritated skin.  Follow up with your caregiver as directed. SEEK IMMEDIATE MEDICAL CARE IF:   You have increasing redness, swelling, or pain in the affected area.  You have a fever. MAKE SURE YOU:  Understand these instructions.  Will watch your condition.  Will get help right away if you are not doing well or get worse. Document Released: 11/15/2001 Document Revised: 03/07/2012 Document Reviewed: 12/07/2011 [facility] Patient Information 2015 [place], [place]. This information is not intended to replace advice given to you by your health care provider. Make sure you discuss any questions you have with your health care provider.

## 2014-09-09 NOTE — Progress Notes (Signed)
Subjective:    Patient ID: [name], male    DOB: 01/23/1966, 48 y.o.   MRN: [id number]  Rash This is a new problem. Episode onset: Middle of November. The problem is unchanged. The affected locations include the left lower leg and right lower leg. The rash is characterized by redness. He was exposed to nothing (No new soaps, detergents or cleaning supplies.  Patient recently had cast on left lower leg due to surgery on ankle.  He states the cast was removed about the middle of November.  He states he has had a cast before (years ago).). Past treatments include nothing.  GFR= 85 on 05/16/14 Review of Systems  Constitutional: Negative.   HENT: Negative.        Stuffy nose  Eyes: Negative.   Respiratory: Negative.   Cardiovascular: Negative.   Gastrointestinal: Negative.   Musculoskeletal: Negative.   Skin: Positive for rash.  Neurological: Negative.   Psychiatric/Behavioral: Negative.    Past Medical History  Diagnosis Date  . Community acquired pneumonia   . Hypercholesteremia   . Depression   . Shortness of breath   . Chest pain   . Pulmonary embolism     6/12  . GERD (gastroesophageal reflux disease)   . Palpitations   . Gout   . Hypogonadism male   . Vitamin D deficiency    Current Outpatient Prescriptions on File Prior to Visit  Medication Sig Dispense Refill  . aspirin 325 MG tablet Take 325 mg by mouth daily.    . citalopram (CELEXA) 20 MG tablet Take 1 tablet (20 mg total) by mouth daily. 90 tablet 1  . colchicine 0.6 MG tablet Take 1 tablet 1 to 2 x daily as directed for gout 90 tablet 0  . divalproex (DEPAKOTE) 250 MG DR tablet TAKE 1 TABLET DAILY 90 tablet 1  . febuxostat (ULORIC) 40 MG tablet Take 1 tablet (40 mg total) by mouth daily. 90 tablet 1  . omeprazole (PRILOSEC) 20 MG capsule Take 20 mg by mouth daily.    . rosuvastatin (CRESTOR) 40 MG tablet Take 1 tablet (40 mg total) by mouth daily. 90 tablet 1   No current facility-administered medications on  file prior to visit.   Allergies  Allergen Reactions  . Cymbalta [Duloxetine Hcl]   . Lexapro [Escitalopram]      BP 132/90 mmHg  Pulse 78  Temp(Src) 98.4 F (36.9 C) (Temporal)  Resp 18  Ht 6' 0.25" (1.835 m)  Wt 279 lb (126.554 kg)  BMI 37.58 kg/m2 Wt Readings from Last 3 Encounters:  09/09/14 279 lb (126.554 kg)  05/16/14 273 lb 6.4 oz (124.013 kg)  04/10/14 265 lb (120.203 kg)   Objective:   Physical Exam  Constitutional: He is oriented to person, place, and time. He appears well-developed and well-nourished. He does not have a sickly appearance. No distress.  HENT:  Head: Normocephalic.  Eyes: Conjunctivae and lids are normal. Right eye exhibits no discharge. Left eye exhibits no discharge. No scleral icterus.  Neck: Normal range of motion and phonation normal.  Cardiovascular: Normal rate, regular rhythm, S1 normal, S2 normal, normal heart sounds, intact distal pulses and normal pulses.  Exam reveals no gallop, no distant heart sounds and no friction rub.   No murmur heard. Pulmonary/Chest: Effort normal and breath sounds normal. No respiratory distress. He has no decreased breath sounds. He has no wheezes. He has no rhonchi. He has no rales. He exhibits no tenderness.  Abdominal: Soft. Bowel sounds  are normal.  Neurological: He is alert and oriented to person, place, and time. He has normal strength. No sensory deficit. Gait normal.  Skin: Skin is warm, dry and intact. Rash noted. Rash is papular. He is not diaphoretic. No erythema. No pallor.  Erythematous follicular non-pruritic diffuse papules seen bilaterally on lower extremities from knees to ankles.    Psychiatric: He has a normal mood and affect. His speech is normal and behavior is normal. Judgment and thought content normal. Cognition and memory are normal.  Vitals reviewed.  Assessment & Plan:  1. Folliculitis and perifolliculitis -Take Doxycycline as prescribed- doxycycline (VIBRA-TABS) 100 MG tablet; Take 1  tablet (100 mg total) by mouth 2 (two) times daily. 7 days  Dispense: 14 tablet; Refill: 1 -Can try to soak rash areas with 1/2 cup of Clorox in tub and soak once a day to help prevent MRSA per Dr. Oneta RackMcKeown.  Discussed medication effects and SE's.  Pt agreed to treatment plan. If you are not feeling better in 10-14 days, then please call the office. Please keep your physical appt on 10/10/14.  [name], Donald AuerJennifer L, PA-C 11:46 AM [facility]

## 2014-09-18 ENCOUNTER — Encounter: Payer: Self-pay | Admitting: Podiatry

## 2014-09-18 ENCOUNTER — Ambulatory Visit (INDEPENDENT_AMBULATORY_CARE_PROVIDER_SITE_OTHER): Payer: BC Managed Care – PPO

## 2014-09-18 ENCOUNTER — Ambulatory Visit (INDEPENDENT_AMBULATORY_CARE_PROVIDER_SITE_OTHER): Payer: BC Managed Care – PPO | Admitting: Podiatry

## 2014-09-18 VITALS — BP 122/81 | HR 76 | Resp 18

## 2014-09-18 DIAGNOSIS — Z09 Encounter for follow-up examination after completed treatment for conditions other than malignant neoplasm: Secondary | ICD-10-CM

## 2014-09-18 NOTE — Patient Instructions (Signed)
Follow up with physical therapy

## 2014-09-20 ENCOUNTER — Encounter: Payer: Self-pay | Admitting: Podiatry

## 2014-09-20 NOTE — Progress Notes (Signed)
Patient ID: [name], male   DOB: Jan 04, 1966, 49 y.o.   MRN: [id number]  Subjective: 49 year old male returns the office they for follow-up evaluation status post left retrocalcaneal exostectomy with reattachment of Achilles tendon. The patient states that since last appointment he has gone on vacation and he believes that he overdid it on vacation as he was very active. After he became more active he started to develop symptoms in the back of his heel. He states that the area of pain on the back of his heel is different from where he had pain prior to surgery. He states that the area that he had pain prior to surgery has resolved however since the surgery he has pain in a new area which she believes is around with a anchors are at. He has been able to ambulate in a regular sneaker without much difficulty.   Patient also states that since coming out of the cast he has noted small bumps on both of his legs. He has seen his primary care physician who diagnosed him with folliculitis and he was given a course of doxycycline which she has completed. He is inquiring if he should continue the doxycycline. He states the areas do not itch and they are much improved compared to prior. No other complaints at this time.  Objective: AAO 3, NAD DP/PT pulses palpable bilaterally, CRT less than 3 seconds Protective sensation intact with Simms Weinstein monofilament, vibratory sensation intact, Achilles tendon reflex intact. There is mild edema overlying the left heel at the site of the surgery however it is improved compared to prior. There is no overlying erythema or increase in warmth. This is small area of discomfort overlying the posterior aspect left heel on the posterior lateral aspect. The area of prior concern before surgery over the exostosis is not symptomatically. Achilles tendon on the left doesn't appear to be tight compared to the right. The Achilles tendon is intact.  Scar over the posterior aspect  of the left heel is well healed at this time.  There are small diffuse papules to bilateral lower extremities without any surrounding erythema or drainage. There is no clinical signs of infection at this time. Subjective the patient states that the area has improved. No pain with calf compression, swelling, warmth, erythema. No open lesions at this time.  Assessment: 49 year old male status post left retrocalcaneal exostectomy with reattachment of Achilles tendon; possible folliculitis resolving  Plan: -X-rays were obtained and reviewed with the patient. -Treatment options were discussed the patient including alternatives, risks, complications. -At this time the possible folliculitis appears to be resolving. Also recommended the patient to apply a low dose OTC steroid cream to the area. -There is equinus on the left compared to the right and given the surgery with debridement of the Achilles tendon reattachment patient may likely benefit from physical therapy to help with strengthening as well as stretching. A prescription for physical therapy was given to the patient. -Discussed with the patient that if the area remains syndromatic he may need to have the bone anchors/suture removed in the future however at this time we will await physical therapy and for further healing. Continue to ice the area. Discussed shoe gear modifications, possible orthotics.  -Follow-up in 6 weeks or sooner should any problems arise. In the meantime, call the office with any question, concerns, change in symptoms.

## 2014-10-07 ENCOUNTER — Encounter: Payer: Self-pay | Admitting: Emergency Medicine

## 2014-10-10 ENCOUNTER — Ambulatory Visit (INDEPENDENT_AMBULATORY_CARE_PROVIDER_SITE_OTHER): Payer: BLUE CROSS/BLUE SHIELD | Admitting: Internal Medicine

## 2014-10-10 ENCOUNTER — Encounter: Payer: Self-pay | Admitting: Internal Medicine

## 2014-10-10 VITALS — BP 120/80 | HR 72 | Temp 97.8°F | Resp 16 | Ht 72.25 in | Wt 273.0 lb

## 2014-10-10 DIAGNOSIS — Z79899 Other long term (current) drug therapy: Secondary | ICD-10-CM

## 2014-10-10 DIAGNOSIS — R7309 Other abnormal glucose: Secondary | ICD-10-CM

## 2014-10-10 DIAGNOSIS — I10 Essential (primary) hypertension: Secondary | ICD-10-CM

## 2014-10-10 DIAGNOSIS — M1009 Idiopathic gout, multiple sites: Secondary | ICD-10-CM

## 2014-10-10 DIAGNOSIS — Z125 Encounter for screening for malignant neoplasm of prostate: Secondary | ICD-10-CM

## 2014-10-10 DIAGNOSIS — M109 Gout, unspecified: Secondary | ICD-10-CM | POA: Insufficient documentation

## 2014-10-10 DIAGNOSIS — R5383 Other fatigue: Secondary | ICD-10-CM

## 2014-10-10 DIAGNOSIS — E559 Vitamin D deficiency, unspecified: Secondary | ICD-10-CM

## 2014-10-10 DIAGNOSIS — M1000 Idiopathic gout, unspecified site: Secondary | ICD-10-CM

## 2014-10-10 DIAGNOSIS — R7303 Prediabetes: Secondary | ICD-10-CM

## 2014-10-10 DIAGNOSIS — E782 Mixed hyperlipidemia: Secondary | ICD-10-CM

## 2014-10-10 DIAGNOSIS — Z113 Encounter for screening for infections with a predominantly sexual mode of transmission: Secondary | ICD-10-CM

## 2014-10-10 DIAGNOSIS — R945 Abnormal results of liver function studies: Secondary | ICD-10-CM

## 2014-10-10 DIAGNOSIS — K219 Gastro-esophageal reflux disease without esophagitis: Secondary | ICD-10-CM

## 2014-10-10 DIAGNOSIS — Z1212 Encounter for screening for malignant neoplasm of rectum: Secondary | ICD-10-CM

## 2014-10-10 DIAGNOSIS — R7989 Other specified abnormal findings of blood chemistry: Secondary | ICD-10-CM

## 2014-10-10 LAB — RPR

## 2014-10-10 LAB — CBC WITH DIFFERENTIAL/PLATELET
Basophils Absolute: 0 10*3/uL (ref 0.0–0.1)
Basophils Relative: 0 % (ref 0–1)
EOS ABS: 0.2 10*3/uL (ref 0.0–0.7)
Eosinophils Relative: 2 % (ref 0–5)
HEMATOCRIT: 44.8 % (ref 39.0–52.0)
Hemoglobin: 15.6 g/dL (ref 13.0–17.0)
Lymphocytes Relative: 20 % (ref 12–46)
Lymphs Abs: 2 10*3/uL (ref 0.7–4.0)
MCH: 29.8 pg (ref 26.0–34.0)
MCHC: 34.8 g/dL (ref 30.0–36.0)
MCV: 85.5 fL (ref 78.0–100.0)
MPV: 9.2 fL (ref 8.6–12.4)
Monocytes Absolute: 0.7 10*3/uL (ref 0.1–1.0)
Monocytes Relative: 7 % (ref 3–12)
NEUTROS ABS: 7.2 10*3/uL (ref 1.7–7.7)
NEUTROS PCT: 71 % (ref 43–77)
PLATELETS: 267 10*3/uL (ref 150–400)
RBC: 5.24 MIL/uL (ref 4.22–5.81)
RDW: 13.4 % (ref 11.5–15.5)
WBC: 10.2 10*3/uL (ref 4.0–10.5)

## 2014-10-10 LAB — HIV ANTIBODY (ROUTINE TESTING W REFLEX): HIV: NONREACTIVE

## 2014-10-10 LAB — HEPATITIS B CORE ANTIBODY, TOTAL: Hep B Core Total Ab: NONREACTIVE

## 2014-10-10 LAB — TSH: TSH: 1.392 u[IU]/mL (ref 0.350–4.500)

## 2014-10-10 LAB — HEPATITIS B SURFACE ANTIBODY,QUALITATIVE: Hep B S Ab: NEGATIVE

## 2014-10-10 LAB — VITAMIN B12: VITAMIN B 12: 403 pg/mL (ref 211–911)

## 2014-10-10 LAB — HEPATITIS C ANTIBODY: HCV AB: NEGATIVE

## 2014-10-10 LAB — HEMOGLOBIN A1C
Hgb A1c MFr Bld: 5.7 % — ABNORMAL HIGH (ref <5.7)
MEAN PLASMA GLUCOSE: 117 mg/dL — AB (ref <117)

## 2014-10-10 LAB — HEPATITIS A ANTIBODY, TOTAL: Hep A Total Ab: NONREACTIVE

## 2014-10-10 NOTE — Patient Instructions (Signed)
 Recommend the book "The END of DIETING" by Dr Joel Fuhrman   & the book "The END of DIABETES " by Dr Joel Fuhrman  At [employer] - get book & Audio CD's      Being diabetic has a  300% increased risk for heart attack, stroke, cancer, and alzheimer- type vascular dementia. It is very important that you work harder with diet by avoiding all foods that are white except chicken & fish. Avoid white rice (brown & wild rice is OK), white potatoes (sweetpotatoes in moderation is OK), White bread or wheat bread or anything made out of white flour like bagels, donuts, rolls, buns, biscuits, cakes, pastries, cookies, pizza crust, and pasta (made from white flour & egg whites) - vegetarian pasta or spinach or wheat pasta is OK. Multigrain breads like Arnold's or [employer], or multigrain sandwich thins or flatbreads.  Diet, exercise and weight loss can reverse and cure diabetes in the early stages.  Diet, exercise and weight loss is very important in the control and prevention of complications of diabetes which affects every system in your body, ie. Brain - dementia/stroke, eyes - glaucoma/blindness, heart - heart attack/heart failure, kidneys - dialysis, stomach - gastric paralysis, intestines - malabsorption, nerves - severe painful neuritis, circulation - gangrene & loss of a leg(s), and finally cancer and Alzheimers.    I recommend avoid fried & greasy foods,  sweets/candy, white rice (brown or wild rice or Quinoa is OK), white potatoes (sweet potatoes are OK) - anything made from white flour - bagels, doughnuts, rolls, buns, biscuits,white and wheat breads, pizza crust and traditional pasta made of white flour & egg white(vegetarian pasta or spinach or wheat pasta is OK).  Multi-grain bread is OK - like multi-grain flat bread or sandwich thins. Avoid alcohol in excess. Exercise is also important.    Eat all the vegetables you want - avoid meat, especially red meat and dairy - especially cheese.  Cheese  is the most concentrated form of trans-fats which is the worst thing to clog up our arteries. Veggie cheese is OK which can be found in the fresh produce section at [employer] or [employer] or [employer]  Preventive Care for Adults  A healthy lifestyle and preventive care can promote health and wellness. Preventive health guidelines for men include the following key practices:  A routine yearly physical is a good way to check with your health care provider about your health and preventative screening. It is a chance to share any concerns and updates on your health and to receive a thorough exam.  Visit your dentist for a routine exam and preventative care every 6 months. Brush your teeth twice a day and floss once a day. Good oral hygiene prevents tooth decay and gum disease.  The frequency of eye exams is based on your age, health, family medical history, use of contact lenses, and other factors. Follow your health care provider's recommendations for frequency of eye exams.  Eat a healthy diet. Foods such as vegetables, fruits, whole grains, low-fat dairy products, and lean protein foods contain the nutrients you need without too many calories. Decrease your intake of foods high in solid fats, added sugars, and salt. Eat the right amount of calories for you.Get information about a proper diet from your health care provider, if necessary.  Regular physical exercise is one of the most important things you can do for your health. Most adults should get at least 150 minutes of moderate-intensity exercise (any activity   that increases your heart rate and causes you to sweat) each week. In addition, most adults need muscle-strengthening exercises on 2 or more days a week.  Maintain a healthy weight. The body mass index (BMI) is a screening tool to identify possible weight problems. It provides an estimate of body fat based on height and weight. Your health care provider can find your BMI and can help  you achieve or maintain a healthy weight.For adults 20 years and older:  A BMI below 18.5 is considered underweight.  A BMI of 18.5 to 24.9 is normal.  A BMI of 25 to 29.9 is considered overweight.  A BMI of 30 and above is considered obese.  Maintain normal blood lipids and cholesterol levels by exercising and minimizing your intake of saturated fat. Eat a balanced diet with plenty of fruit and vegetables. Blood tests for lipids and cholesterol should begin at age 20 and be repeated every 5 years. If your lipid or cholesterol levels are high, you are over 50, or you are at high risk for heart disease, you may need your cholesterol levels checked more frequently.Ongoing high lipid and cholesterol levels should be treated with medicines if diet and exercise are not working.  If you smoke, find out from your health care provider how to quit. If you do not use tobacco, do not start.  Lung cancer screening is recommended for adults aged 55-80 years who are at high risk for developing lung cancer because of a history of smoking. A yearly low-dose CT scan of the lungs is recommended for people who have at least a 30-pack-year history of smoking and are a current smoker or have quit within the past 15 years. A pack year of smoking is smoking an average of 1 pack of cigarettes a day for 1 year (for example: 1 pack a day for 30 years or 2 packs a day for 15 years). Yearly screening should continue until the smoker has stopped smoking for at least 15 years. Yearly screening should be stopped for people who develop a health problem that would prevent them from having lung cancer treatment.  If you choose to drink alcohol, do not have more than 2 drinks per day. One drink is considered to be 12 ounces (355 mL) of beer, 5 ounces (148 mL) of wine, or 1.5 ounces (44 mL) of liquor.  Avoid use of street drugs. Do not share needles with anyone. Ask for help if you need support or instructions about stopping the  use of drugs.  High blood pressure causes heart disease and increases the risk of stroke. Your blood pressure should be checked at least every 1-2 years. Ongoing high blood pressure should be treated with medicines, if weight loss and exercise are not effective.  If you are 45-79 years old, ask your health care provider if you should take aspirin to prevent heart disease.  Diabetes screening involves taking a blood sample to check your fasting blood sugar level. This should be done once every 3 years, after age 45, if you are within normal weight and without risk factors for diabetes. Testing should be considered at a younger age or be carried out more frequently if you are overweight and have at least 1 risk factor for diabetes.  Colorectal cancer can be detected and often prevented. Most routine colorectal cancer screening begins at the age of 50 and continues through age 75. However, your health care provider may recommend screening at an earlier age if you have   risk factors for colon cancer. On a yearly basis, your health care provider may provide home test kits to check for hidden blood in the stool. Use of a small camera at the end of a tube to directly examine the colon (sigmoidoscopy or colonoscopy) can detect the earliest forms of colorectal cancer. Talk to your health care provider about this at age 50, when routine screening begins. Direct exam of the colon should be repeated every 5-10 years through age 75, unless early forms of precancerous polyps or small growths are found.   Talk with your health care provider about prostate cancer screening.  Testicular cancer screening isrecommended for adult males. Screening includes self-exam, a health care provider exam, and other screening tests. Consult with your health care provider about any symptoms you have or any concerns you have about testicular cancer.  Use sunscreen. Apply sunscreen liberally and repeatedly throughout the day. You should  seek shade when your shadow is shorter than you. Protect yourself by wearing long sleeves, pants, a wide-brimmed hat, and sunglasses year round, whenever you are outdoors.  Once a month, do a whole-body skin exam, using a mirror to look at the skin on your back. Tell your health care provider about new moles, moles that have irregular borders, moles that are larger than a pencil eraser, or moles that have changed in shape or color.  Stay current with required vaccines (immunizations).  Influenza vaccine. All adults should be immunized every year.  Tetanus, diphtheria, and acellular pertussis (Td, Tdap) vaccine. An adult who has not previously received Tdap or who does not know his vaccine status should receive 1 dose of Tdap. This initial dose should be followed by tetanus and diphtheria toxoids (Td) booster doses every 10 years. Adults with an unknown or incomplete history of completing a 3-dose immunization series with Td-containing vaccines should begin or complete a primary immunization series including a Tdap dose. Adults should receive a Td booster every 10 years.  Varicella vaccine. An adult without evidence of immunity to varicella should receive 2 doses or a second dose if he has previously received 1 dose.  Human papillomavirus (HPV) vaccine. Males aged 13-21 years who have not received the vaccine previously should receive the 3-dose series. Males aged 22-26 years may be immunized. Immunization is recommended through the age of 26 years for any male who has sex with males and did not get any or all doses earlier. Immunization is recommended for any person with an immunocompromised condition through the age of 26 years if he did not get any or all doses earlier. During the 3-dose series, the second dose should be obtained 4-8 weeks after the first dose. The third dose should be obtained 24 weeks after the first dose and 16 weeks after the second dose.  Zoster vaccine. One dose is recommended  for adults aged 60 years or older unless certain conditions are present.    PREVNAR  - Pneumococcal 13-valent conjugate (PCV13) vaccine. When indicated, a person who is uncertain of his immunization history and has no record of immunization should receive the PCV13 vaccine. An adult aged 19 years or older who has certain medical conditions and has not been previously immunized should receive 1 dose of PCV13 vaccine. This PCV13 should be followed with a dose of pneumococcal polysaccharide (PPSV23) vaccine. The PPSV23 vaccine dose should be obtained at least 8 weeks after the dose of PCV13 vaccine. An adult aged 19 years or older who has certain medical conditions and previously   received 1 or more doses of PPSV23 vaccine should receive 1 dose of PCV13. The PCV13 vaccine dose should be obtained 1 or more years after the last PPSV23 vaccine dose.    PNEUMOVAX - Pneumococcal polysaccharide (PPSV23) vaccine. When PCV13 is also indicated, PCV13 should be obtained first. All adults aged 65 years and older should be immunized. An adult younger than age 65 years who has certain medical conditions should be immunized. Any person who resides in a nursing home or long-term care facility should be immunized. An adult smoker should be immunized. People with an immunocompromised condition and certain other conditions should receive both PCV13 and PPSV23 vaccines. People with human immunodeficiency virus (HIV) infection should be immunized as soon as possible after diagnosis. Immunization during chemotherapy or radiation therapy should be avoided. Routine use of PPSV23 vaccine is not recommended for American Indians, [place] Natives, or people younger than 65 years unless there are medical conditions that require PPSV23 vaccine. When indicated, people who have unknown immunization and have no record of immunization should receive PPSV23 vaccine. One-time revaccination 5 years after the first dose of PPSV23 is recommended for  people aged 19-64 years who have chronic kidney failure, nephrotic syndrome, asplenia, or immunocompromised conditions. People who received 1-2 doses of PPSV23 before age 65 years should receive another dose of PPSV23 vaccine at age 65 years or later if at least 5 years have passed since the previous dose. Doses of PPSV23 are not needed for people immunized with PPSV23 at or after age 65 years.    Hepatitis A vaccine. Adults who wish to be protected from this disease, have certain high-risk conditions, work with hepatitis A-infected animals, work in hepatitis A research labs, or travel to or work in countries with a high rate of hepatitis A should be immunized. Adults who were previously unvaccinated and who anticipate close contact with an international adoptee during the first 60 days after arrival in the [place] from a country with a high rate of hepatitis A should be immunized.    Hepatitis B vaccine. Adults should be immunized if they wish to be protected from this disease, have certain high-risk conditions, may be exposed to blood or other infectious body fluids, are household contacts or sex partners of hepatitis B positive people, are clients or workers in certain care facilities, or travel to or work in countries with a high rate of hepatitis B.   Preventive Service / Frequency   Ages 40 to 64  Blood pressure check.  Lipid and cholesterol check  Lung cancer screening. / Every year if you are aged 55-80 years and have a 30-pack-year history of smoking and currently smoke or have quit within the past 15 years. Yearly screening is stopped once you have quit smoking for at least 15 years or develop a health problem that would prevent you from having lung cancer treatment.  Fecal occult blood test (FOBT) of stool. / Every year beginning at age 50 and continuing until age 75. You may not have to do this test if you get a colonoscopy every 10 years.  Flexible sigmoidoscopy** or  colonoscopy.** / Every 5 years for a flexible sigmoidoscopy or every 10 years for a colonoscopy beginning at age 50 and continuing until age 75. Screening for abdominal aortic aneurysm (AAA)  by ultrasound is recommended for people who have history of high blood pressure or who are current or former smokers. 

## 2014-10-10 NOTE — Progress Notes (Signed)
Patient ID: [name], male   DOB: 05/16/1966, 49 y.o.   MRN: [id number] Annual Comprehensive Examination  This very nice 49 y.o. MWM presents for complete physical.  Patient has been followed for Labile HTN, Prediabetes, Hyperlipidemia, and Vitamin D Deficiency.   Patient has hx/o labile elevated BP's in the past & has been monitored expectantly.  Patient's BP has been controlled and today's BP: 120/80 mmHg. Patient denies any cardiac symptoms as chest pain, palpitations, shortness of breath, dizziness or ankle swelling.   Patient's hyperlipidemia is controlled with diet & medications. Patient denies myalgias or other medication SE's. Last lipids were at goal - Total Chol 165; HDL  35*;f 36.78 & consequent  LDL  83; with elevated Triglycerides 236 on 05/16/2014.   Patient has Morbid Obesity with a BMI oprediabetes since Oct 2013 with an A1c 5.9 &  patient denies reactive hypoglycemic symptoms, visual blurring, diabetic polys or paresthesias. Last A1c was  6.0% on  05/16/2014.   Finally, patient has history of Vitamin D Deficiency of 28 in Jan 2013 and last vitamin D was  43 on  05/16/2014.  Medication Sig  . aspirin 325 MG tablet Take 325 mg by mouth daily.  . citalopram (CELEXA) 20 MG tablet Take 1 tablet (20 mg total) by mouth daily.  . colchicine 0.6 MG tablet Take 1 tablet 1 to 2 x daily as directed for gout  . divalproex (DEPAKOTE) 250 MG DR tablet TAKE 1 TABLET DAILY  . enoxaparin (LOVENOX) 40 MG/0.4ML injection   . febuxostat (ULORIC) 40 MG tablet Take 1 tablet (40 mg total) by mouth daily.  Marland Kitchen. omeprazole (PRILOSEC) 20 MG capsule Take 20 mg by mouth daily.  . rosuvastatin (CRESTOR) 40 MG tablet Take 1 tablet (40 mg total) by mouth daily.  . TRANSDERM-SCOP 1 MG/3DAYS    Allergies  Allergen Reactions  . Cymbalta [Duloxetine Hcl]   . Lexapro [Escitalopram]    Past Medical History  Diagnosis Date  . Community acquired pneumonia   . Hypercholesteremia   . Depression   . Shortness  of breath   . Chest pain   . Pulmonary embolism     6/12  . GERD (gastroesophageal reflux disease)   . Palpitations   . Gout   . Hypogonadism male   . Vitamin D deficiency    Health Maintenance  Topic Date Due  . INFLUENZA VACCINE  04/21/2015  . TETANUS/TDAP  05/21/2021   Immunization History  Administered Date(s) Administered  . Influenza Split 06/27/2012, 07/03/2014  . Td 09/20/2000  . Tdap 05/22/2011   Past Surgical History  Procedure Laterality Date  . Eye surgery      Lasik  . Tonsillectomy and adenoidectomy    . Vasectomy     Family History  Problem Relation Age of Onset  . Prostate cancer Father   . Melanoma Father   . Cancer Father     Prostate Cancer  . Hypertension Father   . Emphysema Maternal Grandmother     was a smoker  . Hyperlipidemia Maternal Grandmother   . Multiple sclerosis Maternal Uncle   . Stroke Maternal Grandfather   . Hyperlipidemia Maternal Grandfather    History   Social History  . Marital Status: Married    Spouse Name: N/A    Number of Children: 1  . Years of Education: N/A   Occupational History  . Attorney Disability    Social History Main Topics  . Smoking status: Never Smoker   . Smokeless tobacco:  Never Used  . Alcohol Use: No  . Drug Use: No  . Sexual Activity: Not on file    ROS Constitutional: Denies fever, chills, weight loss/gain, headaches, insomnia, fatigue, night sweats or change in appetite. Eyes: Denies redness, blurred vision, diplopia, discharge, itchy or watery eyes.  ENT: Denies discharge, congestion, post nasal drip, epistaxis, sore throat, earache, hearing loss, dental pain, Tinnitus, Vertigo, Sinus pain or snoring.  Cardio: Denies chest pain, palpitations, irregular heartbeat, syncope, dyspnea, diaphoresis, orthopnea, PND, claudication or edema Respiratory: denies cough, dyspnea, DOE, pleurisy, hoarseness, laryngitis or wheezing.  Gastrointestinal: Denies dysphagia, heartburn, reflux, water brash,  pain, cramps, nausea, vomiting, bloating, diarrhea, constipation, hematemesis, melena, hematochezia, jaundice or hemorrhoids Genitourinary: Denies dysuria, frequency, urgency, nocturia, hesitancy, discharge, hematuria or flank pain Musculoskeletal: Denies arthralgia, myalgia, stiffness, Jt. Swelling, pain, limp or strain/sprain. Denies Falls. Skin: Denies puritis, rash, hives, warts, acne, eczema or change in skin lesion Neuro: No weakness, tremor, incoordination, spasms, paresthesia or pain Psychiatric: Denies confusion, memory loss or sensory loss. Denies Depression. Endocrine: Denies change in weight, skin, hair change, nocturia, and paresthesia, diabetic polys, visual blurring or hyper / hypo glycemic episodes.  Heme/Lymph: No excessive bleeding, bruising or enlarged lymph nodes.  Physical Exam  BP 120/80   P 72  T 97.8 F   R 16  Ht 6' 0.25"   Wt 273 lb     BMI 36.78   General Appearance: Over nourished and in no apparent distress. Eyes: PERRLA, EOMs, conjunctiva no swelling or erythema, normal fundi and vessels. Sinuses: No frontal/maxillary tenderness ENT/Mouth: EACs patent / TMs  nl. Nares clear without erythema, swelling, mucoid exudates. Oral hygiene is good. No erythema, swelling, or exudate. Tongue normal, non-obstructing. Tonsils not swollen or erythematous. Hearing normal.  Neck: Supple, thyroid normal. No bruits, nodes or JVD. Respiratory: Respiratory effort normal.  BS equal and clear bilateral without rales, rhonci, wheezing or stridor. Cardio: Heart sounds are normal with regular rate and rhythm and no murmurs, rubs or gallops. Peripheral pulses are normal and equal bilaterally without edema. No aortic or femoral bruits. Chest: symmetric with normal excursions and percussion.  Abdomen: Flat, soft, with bowl sounds. Nontender, no guarding, rebound, hernias, masses, or organomegaly.  Lymphatics: Non tender without lymphadenopathy.  Genitourinary: No hernias.Testes nl. DRE  - prostate nl for age - smooth & firm w/o nodules. Musculoskeletal: Full ROM all peripheral extremities, joint stability, 5/5 strength, and normal gait. Skin: Warm and dry without rashes, lesions, cyanosis, clubbing or  ecchymosis.  Neuro: Cranial nerves intact, reflexes equal bilaterally. Normal muscle tone, no cerebellar symptoms. Sensation intact.  Pysch: Awake and oriented X 3 with normal affect, insight and judgment appropriate.   Assessment and Plan  1. Hypertension, Labile 2. Hyperlipidemia 3. Pre Diabetes 4. Vitamin D Deficiency 5. GERD 6. Gout   Continue prudent diet as discussed, weight control, BP monitoring, regular exercise, and medications as discussed.  Discussed med effects and SE's. Routine screening labs and tests as requested with regular follow-up as recommended.

## 2014-10-11 ENCOUNTER — Other Ambulatory Visit: Payer: Self-pay | Admitting: Internal Medicine

## 2014-10-11 DIAGNOSIS — E782 Mixed hyperlipidemia: Secondary | ICD-10-CM

## 2014-10-11 LAB — BASIC METABOLIC PANEL WITH GFR
BUN: 12 mg/dL (ref 6–23)
CO2: 27 meq/L (ref 19–32)
Calcium: 9.4 mg/dL (ref 8.4–10.5)
Chloride: 104 mEq/L (ref 96–112)
Creat: 1.15 mg/dL (ref 0.50–1.35)
GFR, Est African American: 87 mL/min
GFR, Est Non African American: 75 mL/min
GLUCOSE: 82 mg/dL (ref 70–99)
Potassium: 4.5 mEq/L (ref 3.5–5.3)
Sodium: 141 mEq/L (ref 135–145)

## 2014-10-11 LAB — HEPATIC FUNCTION PANEL
ALK PHOS: 60 U/L (ref 39–117)
ALT: 26 U/L (ref 0–53)
AST: 18 U/L (ref 0–37)
Albumin: 4.3 g/dL (ref 3.5–5.2)
Bilirubin, Direct: 0.1 mg/dL (ref 0.0–0.3)
Indirect Bilirubin: 0.4 mg/dL (ref 0.2–1.2)
TOTAL PROTEIN: 6.9 g/dL (ref 6.0–8.3)
Total Bilirubin: 0.5 mg/dL (ref 0.2–1.2)

## 2014-10-11 LAB — PSA: PSA: 0.68 ng/mL (ref <=4.00)

## 2014-10-11 LAB — IRON AND TIBC
%SAT: 22 % (ref 20–55)
Iron: 69 ug/dL (ref 42–165)
TIBC: 318 ug/dL (ref 215–435)
UIBC: 249 ug/dL (ref 125–400)

## 2014-10-11 LAB — MICROALBUMIN / CREATININE URINE RATIO
CREATININE, URINE: 295.5 mg/dL
MICROALB UR: 1.5 mg/dL (ref <2.0)
Microalb Creat Ratio: 5.1 mg/g (ref 0.0–30.0)

## 2014-10-11 LAB — LIPID PANEL
CHOL/HDL RATIO: 4.8 ratio
Cholesterol: 210 mg/dL — ABNORMAL HIGH (ref 0–200)
HDL: 44 mg/dL (ref >39)
LDL CALC: 123 mg/dL — AB (ref 0–99)
Triglycerides: 217 mg/dL — ABNORMAL HIGH (ref <150)
VLDL: 43 mg/dL — ABNORMAL HIGH (ref 0–40)

## 2014-10-11 LAB — URIC ACID: Uric Acid, Serum: 5 mg/dL (ref 4.0–7.8)

## 2014-10-11 LAB — URINALYSIS, MICROSCOPIC ONLY
Bacteria, UA: NONE SEEN
CRYSTALS: NONE SEEN
Casts: NONE SEEN
SQUAMOUS EPITHELIAL / LPF: NONE SEEN

## 2014-10-11 LAB — TESTOSTERONE: Testosterone: 144 ng/dL — ABNORMAL LOW (ref 300–890)

## 2014-10-11 LAB — INSULIN, FASTING: INSULIN FASTING, SERUM: 13.4 u[IU]/mL (ref 2.0–19.6)

## 2014-10-11 LAB — VITAMIN D 25 HYDROXY (VIT D DEFICIENCY, FRACTURES): Vit D, 25-Hydroxy: 21 ng/mL — ABNORMAL LOW (ref 30–100)

## 2014-10-11 LAB — MAGNESIUM: Magnesium: 1.8 mg/dL (ref 1.5–2.5)

## 2014-10-11 MED ORDER — EZETIMIBE 10 MG PO TABS: 10.0000 mg | ORAL_TABLET | Freq: Every day | ORAL | Status: DC

## 2014-10-12 ENCOUNTER — Encounter: Payer: Self-pay | Admitting: Internal Medicine

## 2014-10-14 LAB — HEPATITIS B E ANTIBODY: Hepatitis Be Antibody: NONREACTIVE

## 2014-10-15 ENCOUNTER — Encounter: Payer: Self-pay | Admitting: Physician Assistant

## 2014-10-15 ENCOUNTER — Ambulatory Visit (INDEPENDENT_AMBULATORY_CARE_PROVIDER_SITE_OTHER): Payer: BLUE CROSS/BLUE SHIELD | Admitting: Physician Assistant

## 2014-10-15 VITALS — BP 126/84 | HR 96 | Temp 98.2°F | Resp 18 | Ht 72.25 in | Wt 270.0 lb

## 2014-10-15 DIAGNOSIS — J069 Acute upper respiratory infection, unspecified: Secondary | ICD-10-CM

## 2014-10-15 MED ORDER — PREDNISONE 20 MG PO TABS: ORAL_TABLET | ORAL | Status: AC

## 2014-10-15 MED ORDER — AZITHROMYCIN 250 MG PO TABS: ORAL_TABLET | ORAL | Status: AC

## 2014-10-15 NOTE — Progress Notes (Signed)
HPI  A Caucasian 49 y.o.male presents to the office today due to sore throat and neck stiffness/pain that started 5 days ago.  Patient states sxs are getting worse.  No sick contacts.  He has tried throat lozenges and Mucinex with no relief.  He reports that he had MONO before.  He reports fatigue, congestion, ear pain, sinus pressure, trouble swallowing and muscle CP from shoveling that has improved and is a 1 out of 10 right now.  He denies fever, chills, sweats, rhinorrhea, voice change, chest tightness, SOB, wheezing, abdominal pain, nausea, vomiting, diarrhea, constipation, dizziness, lightheadedness and headaches.  Review of Systems  All other systems reviewed and are negative.  Past Medical History-  Past Medical History  Diagnosis Date  . Community acquired pneumonia   . Hypercholesteremia   . Depression   . Shortness of breath   . Chest pain   . Pulmonary embolism     6/12  . GERD (gastroesophageal reflux disease)   . Palpitations   . Gout   . Hypogonadism male   . Vitamin D deficiency    Medications-  Current Outpatient Prescriptions on File Prior to Visit  Medication Sig Dispense Refill  . aspirin 325 MG tablet Take 325 mg by mouth daily.    . citalopram (CELEXA) 20 MG tablet Take 1 tablet (20 mg total) by mouth daily. 90 tablet 1  . colchicine 0.6 MG tablet Take 1 tablet 1 to 2 x daily as directed for gout 90 tablet 0  . ezetimibe (ZETIA) 10 MG tablet Take 1 tablet (10 mg total) by mouth daily. 30 tablet 11  . febuxostat (ULORIC) 40 MG tablet Take 1 tablet (40 mg total) by mouth daily. 90 tablet 1  . omeprazole (PRILOSEC) 20 MG capsule Take 20 mg by mouth daily.    . rosuvastatin (CRESTOR) 40 MG tablet Take 1 tablet (40 mg total) by mouth daily. 90 tablet 1  . divalproex (DEPAKOTE) 250 MG DR tablet TAKE 1 TABLET DAILY 90 tablet 1   No current facility-administered medications on file prior to visit.   Allergies-  Allergies  Allergen Reactions  . Cymbalta  [Duloxetine Hcl]   . Lexapro [Escitalopram]    Physical Exam BP 126/84 mmHg  Pulse 96  Temp(Src) 98.2 F (36.8 C) (Temporal)  Resp 18  Ht 6' 0.25" (1.835 m)  Wt 270 lb (122.471 kg)  BMI 36.37 kg/m2  SpO2 98% Wt Readings from Last 3 Encounters:  10/15/14 270 lb (122.471 kg)  10/10/14 273 lb (123.832 kg)  09/09/14 279 lb (126.554 kg)  Vitals Reviewed. General Appearance: Well nourished, in no apparent distress and had pleasant demeanor. Obese. Sickly appearance. Eyes:  PERRLA. EOMI. Conjunctiva is pink without edema, erythema or yellowing. No scleral icterus.  Sinuses: No Frontal/maxillary tenderness Ears: No erythema, edema or tenderness on both external ear cartilages and ear canals.  TMs are intact bilaterally with normal light reflexes and without erythema, edema or bulging. Nose: Nose is symmetrical and turbinates are erythematous and non-edematous bilaterally.  No polyps or paleness.  Rhinorrhea present. Throat: Oral pharynx is pink and moist. Erythematous and non-edematous posterior pharynx without exudate.  Mucosa is intact and without lesions. Uvula is midline and not swollen. Neck: Supple,  LAD, trachea is midline. Tenderness upon palpation to neck muscles bilaterally.  Full range of motion in neck intact.  Negative Brudzinski's and Kernig's sign.   Respiratory: CTAB,  r/r/w or stridor. No increased effort of breathing. Cardio: RRR.   m/r/g.  S1S2nl.  Pulses B/L +2  Abdomen: Symmetrical, soft, nontender, and rounded.  +BS nl x4.   Skin: Warm, dry, intact without rashes, lesions, ecchymosis, yellowing, cyanosis.  Neuro: Alert and oriented X3, cooperative.  Mood and affect appropriate to situation.  CN II-XII grossly intact.   Psych: Insight and Judgment appropriate.   Assessment and Plan 1. Acute upper respiratory infection - azithromycin (ZITHROMAX) 250 MG tablet; Take 2 tablets PO on day 1, then 1 tablet PO Q24H x 4 days  Dispense: 6 tablet; Refill: 1 - predniSONE  (DELTASONE) 20 MG tablet; Take 3 tablets PO QDaily for 3 days, then take 2 tablets PO QDaily for 3 days, then take 1 tablet PO QDaily for 3 days  Dispense: 18 tablet; Refill: 0 Told patient to let us know if he has CP, palpitations or SOB.  Discussed medication effects and SE's.  Pt agreed to treatment plan. If you are not feeling better in 10-14 days, then please call the office. Please keep your follow up appt on 01/15/15.  Deyra Perdomo, Lise Auer, PA-C 11:10 AM [facility]

## 2014-10-15 NOTE — Patient Instructions (Signed)
-Take Z-Pak as prescribed _Take Prednisone as prescribed for inflammation. Please drink water to stay hydrated.  -Take Tylenol or Ibuprofen OTC for fever and inflammation.  Take with food.  Follow directions on the bottle. - Salt water gargles. You can also do 1 TSP liquid Maalox and 1 TSP liquid benadryl- mix/ gargle/ spit -Use Chloraseptic spray over the counter to help with pain in throat.  If you are not feeling better in 10-14 days, then please call the office.  Upper Respiratory Infection, Adult An upper respiratory infection (URI) is also sometimes known as the common cold. The upper respiratory tract includes the nose, sinuses, throat, trachea, and bronchi. Bronchi are the airways leading to the lungs. Most people improve within 1 week, but symptoms can last up to 2 weeks. A residual cough may last even longer.  CAUSES Many different viruses can infect the tissues lining the upper respiratory tract. The tissues become irritated and inflamed and often become very moist. Mucus production is also common. A cold is contagious. You can easily spread the virus to others by oral contact. This includes kissing, sharing a glass, coughing, or sneezing. Touching your mouth or nose and then touching a surface, which is then touched by another person, can also spread the virus. SYMPTOMS  Symptoms typically develop 1 to 3 days after you come in contact with a cold virus. Symptoms vary from person to person. They may include:  Runny nose.  Sneezing.  Nasal congestion.  Sinus irritation.  Sore throat.  Loss of voice (laryngitis).  Cough.  Fatigue.  Muscle aches.  Loss of appetite.  Headache.  Low-grade fever. DIAGNOSIS  You might diagnose your own cold based on familiar symptoms, since most people get a cold 2 to 3 times a year. Your caregiver can confirm this based on your exam. Most importantly, your caregiver can check that your symptoms are not due to another disease such as  strep throat, sinusitis, pneumonia, asthma, or epiglottitis. Blood tests, throat tests, and X-rays are not necessary to diagnose a common cold, but they may sometimes be helpful in excluding other more serious diseases. Your caregiver will decide if any further tests are required. RISKS AND COMPLICATIONS  You may be at risk for a more severe case of the common cold if you smoke cigarettes, have chronic heart disease (such as heart failure) or lung disease (such as asthma), or if you have a weakened immune system. The very young and very old are also at risk for more serious infections. Bacterial sinusitis, middle ear infections, and bacterial pneumonia can complicate the common cold. The common cold can worsen asthma and chronic obstructive pulmonary disease (COPD). Sometimes, these complications can require emergency medical care and may be life-threatening. PREVENTION  The best way to protect against getting a cold is to practice good hygiene. Avoid oral or hand contact with people with cold symptoms. Wash your hands often if contact occurs. There is no clear evidence that vitamin C, vitamin E, echinacea, or exercise reduces the chance of developing a cold. However, it is always recommended to get plenty of rest and practice good nutrition. TREATMENT  Treatment is directed at relieving symptoms. There is no cure. Antibiotics are not effective, because the infection is caused by a virus, not by bacteria. Treatment may include:  Increased fluid intake. Sports drinks offer valuable electrolytes, sugars, and fluids.  Breathing heated mist or steam (vaporizer or shower).  Eating chicken soup or other clear broths, and maintaining good nutrition.  Getting plenty of rest.  Using gargles or lozenges for comfort.  Controlling fevers with ibuprofen or acetaminophen as directed by your caregiver.  Increasing usage of your inhaler if you have asthma. Zinc gel and zinc lozenges, taken in the first 24 hours  of the common cold, can shorten the duration and lessen the severity of symptoms. Pain medicines may help with fever, muscle aches, and throat pain. A variety of non-prescription medicines are available to treat congestion and runny nose. Your caregiver can make recommendations and may suggest nasal or lung inhalers for other symptoms.  HOME CARE INSTRUCTIONS   Only take over-the-counter or prescription medicines for pain, discomfort, or fever as directed by your caregiver.  Use a warm mist humidifier or inhale steam from a shower to increase air moisture. This may keep secretions moist and make it easier to breathe.  Drink enough water and fluids to keep your urine clear or pale yellow.  Rest as needed.  Return to work when your temperature has returned to normal or as your caregiver advises. You may need to stay home longer to avoid infecting others. You can also use a face mask and careful hand washing to prevent spread of the virus. SEEK MEDICAL CARE IF:   After the first few days, you feel you are getting worse rather than better.  You need your caregiver's advice about medicines to control symptoms.  You develop chills, worsening shortness of breath, or brown or red sputum. These may be signs of pneumonia.  You develop yellow or brown nasal discharge or pain in the face, especially when you bend forward. These may be signs of sinusitis.  You develop a fever, swollen neck glands, pain with swallowing, or white areas in the back of your throat. These may be signs of strep throat. SEEK IMMEDIATE MEDICAL CARE IF:   You have a fever.  You develop severe or persistent headache, ear pain, sinus pain, or chest pain.  You develop wheezing, a prolonged cough, cough up blood, or have a change in your usual mucus (if you have chronic lung disease).  You develop sore muscles or a stiff neck. Document Released: 03/02/2001 Document Revised: 11/29/2011 Document Reviewed: 12/12/2013 [facility]  Patient Information 2015 [place], [place]. This information is not intended to replace advice given to you by your health care provider. Make sure you discuss any questions you have with your health care provider.

## 2014-11-06 ENCOUNTER — Ambulatory Visit: Payer: BC Managed Care – PPO | Admitting: Podiatry

## 2014-11-16 ENCOUNTER — Other Ambulatory Visit: Payer: Self-pay | Admitting: Internal Medicine

## 2014-11-19 ENCOUNTER — Other Ambulatory Visit: Payer: Self-pay | Admitting: Emergency Medicine

## 2014-11-23 ENCOUNTER — Telehealth: Payer: BLUE CROSS/BLUE SHIELD | Admitting: Family

## 2014-11-23 DIAGNOSIS — R6889 Other general symptoms and signs: Secondary | ICD-10-CM

## 2014-11-23 DIAGNOSIS — Z20828 Contact with and (suspected) exposure to other viral communicable diseases: Secondary | ICD-10-CM

## 2014-11-23 MED ORDER — OSELTAMIVIR PHOSPHATE 75 MG PO CAPS: 75.0000 mg | ORAL_CAPSULE | Freq: Two times a day (BID) | ORAL | Status: DC

## 2014-11-23 NOTE — Progress Notes (Signed)
E visit for Flu like symptoms   We are sorry that you are not feeling well.  Here is how we plan to help! Based on what you have shared with me it looks like you may have possible exposure to a virus that causes influenza.  Influenza or "the flu" is   an infection caused by a respiratory virus. The flu virus is highly contagious and persons who did not receive their yearly flu vaccination may "catch" the flu from close contact.  We have anti-viral medications to treat the viruses that cause this infection. They are not a "cure" and only shorten the course of the infection. These prescriptions are most effective when they are given within the first 2 days of "flu" symptoms. Antiviral medication are indicated if you have a high risk of complications from the flu. You should  also consider an antiviral medication if you are in close contact with someone who is at risk. These medications can help patients avoid complications from the flu  but have side effects that you should know. Possible side effects from Tamiflu or oseltamivir include nausea, vomiting, diarrhea, dizziness, headaches, eye redness, sleep problems or other respiratory symptoms. You should not take Tamiflu if you have an allergy to oseltamivir or any to the ingredients in Tamiflu.  Based upon your symptoms and potential risk factors I have prescribed Oseltamivir (Tamiflu).  It has been sent to your designated pharmacy.  You will take one 75 mg capsule orally twice a day for the next 5 days.  ANYONE WHO HAS FLU SYMPTOMS SHOULD: . Stay home. The flu is highly contagious and going out or to work exposes others! . Be sure to drink plenty of fluids. Water is fine as well as fruit juices, sodas and electrolyte beverages. You may want to stay away from caffeine or alcohol. If you are nauseated, try taking small sips of liquids. How do you know if you are getting enough fluid? Your urine should be a pale yellow or almost colorless. . Get  rest. . Taking a steamy shower or using a humidifier may help nasal congestion and ease sore throat pain. Using a saline nasal spray works much the same way. . Cough drops, hard candies and sore throat lozenges may ease your cough. . Line up a caregiver. Have someone check on you regularly.   GET HELP RIGHT AWAY IF: . You cannot keep down liquids or your medications. . You become short of breath . Your fell like you are going to pass out or loose consciousness. . Your symptoms persist after you have completed your treatment plan MAKE SURE YOU   Understand these instructions.  Will watch your condition.  Will get help right away if you are not doing well or get worse.  Your e-visit answers were reviewed by a board certified advanced clinical practitioner to complete your personal care plan.  Depending on the condition, your plan could have included both over the counter or prescription medications.  If there is a problem please reply  once you have received a response from your provider.  Your safety is important to us.  If you have drug allergies check your prescription carefully.    You can use MyChart to ask questions about today's visit, request a non-urgent call back, or ask for a work or school excuse.  You will get an e-mail in the next two days asking about your experience.  I hope that your e-visit has been valuable and will speed your   recovery. Thank you for using e-visits.   

## 2014-11-26 ENCOUNTER — Encounter: Payer: Self-pay | Admitting: Internal Medicine

## 2014-11-27 ENCOUNTER — Ambulatory Visit (INDEPENDENT_AMBULATORY_CARE_PROVIDER_SITE_OTHER): Payer: BLUE CROSS/BLUE SHIELD

## 2014-11-27 ENCOUNTER — Encounter: Payer: Self-pay | Admitting: Podiatry

## 2014-11-27 ENCOUNTER — Ambulatory Visit (INDEPENDENT_AMBULATORY_CARE_PROVIDER_SITE_OTHER): Payer: BLUE CROSS/BLUE SHIELD | Admitting: Podiatry

## 2014-11-27 VITALS — BP 121/72 | HR 82 | Resp 18

## 2014-11-27 DIAGNOSIS — R52 Pain, unspecified: Secondary | ICD-10-CM

## 2014-11-27 DIAGNOSIS — M2570 Osteophyte, unspecified joint: Secondary | ICD-10-CM

## 2014-11-27 DIAGNOSIS — M898X7 Other specified disorders of bone, ankle and foot: Secondary | ICD-10-CM

## 2014-11-27 NOTE — Patient Instructions (Signed)
Follow-up after physical therapy 

## 2014-11-27 NOTE — Progress Notes (Signed)
Patient ID: Donald BeamsScott G Snow, male   DOB: 09/16/1966, 49 y.o.   MRN: [id number]  Subjective: 49 year old male presents the office they for follow-up evaluation status post left foot retrocalcaneal exostectomy with repair of Achilles tendon. He states that he does continue to have soreness overlying the surgical site. He definitely that since starting physical therapy he does have improvement in symptoms. He states that the soreness is different from the area prior to surgery is in a new area which is present last appointment. He denies any swelling or redness overlying the area. No other complaints at this time. Denies any recent injury or trauma.  Objective: AAO 3, NAD DP/PT pulses palpable, CRT less than 3 seconds Protective sensation intact with Simms once monofilament, vibratory sensation intact, Achilles tendon reflex intact. Incision on the posterior aspect the left Achilles tendon is well coapted without any evidence of dehiscence and a scar is well formed. There is no overlying edema, erythema, increase in warmth. There is mild discomfort along the posterior aspect of the calcaneus along laterally at the insertion of the Achilles tendon. There is equinus present. There is no area pinpoint bony tenderness or pain with vibratory sensation bilatearlly. No pain with calf compression, swelling, warmth, erythema.   Assessment: 49 year old male status post left retrocalcaneal exostectomy with repair of Achilles tendon with continued discomfort although resolving  Plan: -Treatment options discussed included alternatives, risks, complications -X-rays were obtained and reviewed with the patient. -At this time as she states that symptoms do appear to be improving to physical therapy recommend continued with this. Continue with iontophoresis. He states that he has not been able to be consistent with physical therapy due to scheduling been sick. He states that in the coming weeks he has more flexibility and  able to go to physical therapy. -Discussed that if physical therapy does not believe results symptoms can consider EPAT -All up after physical therapy or sooner if any problems are to arise. In the meantime encouraged call the office any questions, concerns, change in symptoms.

## 2014-12-25 ENCOUNTER — Other Ambulatory Visit: Payer: Self-pay | Admitting: *Deleted

## 2014-12-25 MED ORDER — COLCHICINE 0.6 MG PO TABS: ORAL_TABLET | ORAL | Status: AC

## 2015-01-15 ENCOUNTER — Encounter: Payer: Self-pay | Admitting: Physician Assistant

## 2015-01-15 ENCOUNTER — Ambulatory Visit (INDEPENDENT_AMBULATORY_CARE_PROVIDER_SITE_OTHER): Payer: BLUE CROSS/BLUE SHIELD | Admitting: Physician Assistant

## 2015-01-15 VITALS — BP 122/80 | HR 72 | Temp 97.7°F | Resp 16 | Ht 72.0 in | Wt 279.0 lb

## 2015-01-15 DIAGNOSIS — E559 Vitamin D deficiency, unspecified: Secondary | ICD-10-CM

## 2015-01-15 DIAGNOSIS — R7303 Prediabetes: Secondary | ICD-10-CM

## 2015-01-15 DIAGNOSIS — F329 Major depressive disorder, single episode, unspecified: Secondary | ICD-10-CM

## 2015-01-15 DIAGNOSIS — R7309 Other abnormal glucose: Secondary | ICD-10-CM

## 2015-01-15 DIAGNOSIS — F32A Depression, unspecified: Secondary | ICD-10-CM

## 2015-01-15 DIAGNOSIS — E782 Mixed hyperlipidemia: Secondary | ICD-10-CM

## 2015-01-15 DIAGNOSIS — I10 Essential (primary) hypertension: Secondary | ICD-10-CM

## 2015-01-15 DIAGNOSIS — Z79899 Other long term (current) drug therapy: Secondary | ICD-10-CM

## 2015-01-15 LAB — CBC WITH DIFFERENTIAL/PLATELET
BASOS PCT: 1 % (ref 0–1)
Basophils Absolute: 0.1 10*3/uL (ref 0.0–0.1)
EOS ABS: 0.4 10*3/uL (ref 0.0–0.7)
Eosinophils Relative: 5 % (ref 0–5)
HCT: 42.9 % (ref 39.0–52.0)
HEMOGLOBIN: 14.9 g/dL (ref 13.0–17.0)
Lymphocytes Relative: 33 % (ref 12–46)
Lymphs Abs: 2.5 10*3/uL (ref 0.7–4.0)
MCH: 30.3 pg (ref 26.0–34.0)
MCHC: 34.7 g/dL (ref 30.0–36.0)
MCV: 87.4 fL (ref 78.0–100.0)
MONO ABS: 0.4 10*3/uL (ref 0.1–1.0)
MONOS PCT: 5 % (ref 3–12)
MPV: 9 fL (ref 8.6–12.4)
Neutro Abs: 4.3 10*3/uL (ref 1.7–7.7)
Neutrophils Relative %: 56 % (ref 43–77)
Platelets: 291 10*3/uL (ref 150–400)
RBC: 4.91 MIL/uL (ref 4.22–5.81)
RDW: 14.1 % (ref 11.5–15.5)
WBC: 7.6 10*3/uL (ref 4.0–10.5)

## 2015-01-15 MED ORDER — ROSUVASTATIN CALCIUM 40 MG PO TABS: 40.0000 mg | ORAL_TABLET | Freq: Every day | ORAL | Status: AC

## 2015-01-15 NOTE — Progress Notes (Signed)
Assessment and Plan:  1. Hypertension -Continue medication, monitor blood pressure at home. Continue DASH diet.  Reminder to go to the ER if any CP, SOB, nausea, dizziness, severe HA, changes vision/speech, left arm numbness and tingling and jaw pain.  2. Cholesterol -Continue diet and exercise. Check cholesterol.   3. Prediabetes  -Continue diet and exercise. Check A1C  4. Vitamin D Def - check level and will start on 50,000 IU daily for 8 weeks then 2-3 times a week.   5. Obesity with co morbidities - long discussion about weight loss, diet, and exercise -crowded mouth, declines sleep study at this time.    Continue diet and meds as discussed. Further disposition pending results of labs. Over 30 minutes of exam, counseling, chart review, and critical decision making was performed  HPI 49 y.o. male  presents for 3 month follow up on hypertension, cholesterol, prediabetes, and vitamin D deficiency.   His blood pressure has been controlled at home, today their BP is BP: 122/80 mmHg  He does not workout. He denies chest pain, shortness of breath, dizziness. He has a history of PE and is on  ASA.   He is on cholesterol medication,  crestor  1/2 tablet daily but has been out for 2 months and denies myalgias. His cholesterol is at goal less than 130. The cholesterol last visit was:   Lab Results  Component Value Date   CHOL 210* 10/10/2014   HDL 44 10/10/2014   LDLCALC 123* 10/10/2014   TRIG 217* 10/10/2014   CHOLHDL 4.8 10/10/2014   He has been working on diet and exercise for prediabetes, and denies paresthesia of the feet, polydipsia, polyuria and visual disturbances. Last A1C in the office was:  Lab Results  Component Value Date   HGBA1C 5.7* 10/10/2014  Patient is on Vitamin D supplement, is on OTC but states he does not take it regularly and would like to get on prescription.    Lab Results  Component Value Date   VD25OH 21* 10/10/2014  BMI is Body mass index is  37.83 kg/(m^2)., he is working on diet and exercise, has been difficult with foot surgery in OCT and has not been able to exercise.  Wt Readings from Last 3 Encounters:  01/15/15 279 lb (126.554 kg)  10/15/14 270 lb (122.471 kg)  10/10/14 273 lb (123.832 kg)       Current Medications:  Current Outpatient Prescriptions on File Prior to Visit  Medication Sig Dispense Refill  . aspirin 325 MG tablet Take 325 mg by mouth daily.    . citalopram (CELEXA) 20 MG tablet TAKE 1 TABLET (20 MG TOTAL) BY MOUTH DAILY. 90 tablet 1  . colchicine 0.6 MG tablet Take 1 tablet 1 to 2 x daily as directed for gout 180 tablet 0  . divalproex (DEPAKOTE) 250 MG DR tablet TAKE 1 TABLET EVERY DAY 90 tablet 0  . febuxostat (ULORIC) 40 MG tablet Take 1 tablet (40 mg total) by mouth daily. 90 tablet 1  . omeprazole (PRILOSEC) 20 MG capsule Take 20 mg by mouth daily.    Marland Kitchen oseltamivir (TAMIFLU) 75 MG capsule Take 1 capsule (75 mg total) by mouth 2 (two) times daily. 10 capsule 0  . rosuvastatin (CRESTOR) 40 MG tablet Take 1 tablet (40 mg total) by mouth daily. 90 tablet 1   No current facility-administered medications on file prior to visit.   Medical History:  Past Medical History  Diagnosis Date  . Community acquired pneumonia   .  Hypercholesteremia   . Depression   . Shortness of breath   . Chest pain   . Pulmonary embolism     6/12  . GERD (gastroesophageal reflux disease)   . Palpitations   . Gout   . Hypogonadism male   . Vitamin D deficiency    Allergies:  Allergies  Allergen Reactions  . Cymbalta [Duloxetine Hcl]   . Lexapro [Escitalopram]      Review of Systems:  Review of Systems  Constitutional: Negative.   HENT: Negative.   Eyes: Negative.   Respiratory: Negative.   Cardiovascular: Negative.   Gastrointestinal: Negative.   Genitourinary: Negative.   Musculoskeletal: Positive for back pain and joint pain.  Skin: Negative.   Neurological: Negative.   Endo/Heme/Allergies: Negative.    Psychiatric/Behavioral: Negative.     Family history- Review and unchanged Social history- Review and unchanged Physical Exam: BP 122/80 mmHg  Pulse 72  Temp(Src) 97.7 F (36.5 C)  Resp 16  Ht 6' (1.829 m)  Wt 279 lb (126.554 kg)  BMI 37.83 kg/m2 Wt Readings from Last 3 Encounters:  01/15/15 279 lb (126.554 kg)  10/15/14 270 lb (122.471 kg)  10/10/14 273 lb (123.832 kg)   General Appearance: Well nourished, in no apparent distress. Eyes: PERRLA, EOMs, conjunctiva no swelling or erythema Sinuses: No Frontal/maxillary tenderness ENT/Mouth: Ext aud canals clear, TMs without erythema, bulging. No erythema, swelling, or exudate on post pharynx.  Tonsils not swollen or erythematous. Hearing normal.  Neck: Supple, thyroid normal.  Respiratory: Respiratory effort normal, BS equal bilaterally without rales, rhonchi, wheezing or stridor.  Cardio: RRR with no MRGs. Brisk peripheral pulses without edema.  Abdomen: Soft, + BS,  Non tender, no guarding, rebound, hernias, masses. Lymphatics: Non tender without lymphadenopathy.  Musculoskeletal: Full ROM, 5/5 strength, Normal gait Skin: Warm, dry without rashes, lesions, ecchymosis.  Neuro: Cranial nerves intact. Normal muscle tone, no cerebellar symptoms. Psych: Awake and oriented X 3, normal affect, Insight and Judgment appropriate.    Quentin Mullingollier, Amanda, PA-C 8:58 AM [facility]

## 2015-01-15 NOTE — Patient Instructions (Signed)
I think it is possible that you have sleep apnea. It can cause interrupted sleep, headaches, frequent awakenings, fatigue, dry mouth, fast/slow heart beats, memory issues, anxiety/depression, swelling, numbness tingling hands/feet, weight gain, shortness of breath, and the list goes on. Sleep apnea needs to be ruled out because if it is left untreated it does eventually lead to abnormal heart beats, lung failure or heart failure as well as increasing the risk of heart attack and stroke. There are masks you can wear OR a mouth piece that I can give you information about. Often times though people feel MUCH better after getting treatment.   Sleep Apnea  Sleep apnea is a sleep disorder characterized by abnormal pauses in breathing while you sleep. When your breathing pauses, the level of oxygen in your blood decreases. This causes you to move out of deep sleep and into light sleep. As a result, your quality of sleep is poor, and the system that carries your blood throughout your body (cardiovascular system) experiences stress. If sleep apnea remains untreated, the following conditions can develop:  High blood pressure (hypertension).  Coronary artery disease.  Inability to achieve or maintain an erection (impotence).  Impairment of your thought process (cognitive dysfunction). There are three types of sleep apnea: 1. Obstructive sleep apnea--Pauses in breathing during sleep because of a blocked airway. 2. Central sleep apnea--Pauses in breathing during sleep because the area of the brain that controls your breathing does not send the correct signals to the muscles that control breathing. 3. Mixed sleep apnea--A combination of both obstructive and central sleep apnea.  RISK FACTORS The following risk factors can increase your risk of developing sleep apnea:  Being overweight.  Smoking.  Having narrow passages in your nose and throat.  Being of older age.  Being male.  Alcohol use.   Sedative and tranquilizer use.  Ethnicity. Among individuals younger than 35 years, African Americans are at increased risk of sleep apnea. SYMPTOMS   Difficulty staying asleep.  Daytime sleepiness and fatigue.  Loss of energy.  Irritability.  Loud, heavy snoring.  Morning headaches.  Trouble concentrating.  Forgetfulness.  Decreased interest in sex. DIAGNOSIS  In order to diagnose sleep apnea, your caregiver will perform a physical examination. Your caregiver may suggest that you take a home sleep test. Your caregiver may also recommend that you spend the night in a sleep lab. In the sleep lab, several monitors record information about your heart, lungs, and brain while you sleep. Your leg and arm movements and blood oxygen level are also recorded. TREATMENT The following actions may help to resolve mild sleep apnea:  Sleeping on your side.   Using a decongestant if you have nasal congestion.   Avoiding the use of depressants, including alcohol, sedatives, and narcotics.   Losing weight and modifying your diet if you are overweight. There also are devices and treatments to help open your airway:  Oral appliances. These are custom-made mouthpieces that shift your lower jaw forward and slightly open your bite. This opens your airway.  Devices that create positive airway pressure. This positive pressure "splints" your airway open to help you breathe better during sleep. The following devices create positive airway pressure:  Continuous positive airway pressure (CPAP) device. The CPAP device creates a continuous level of air pressure with an air pump. The air is delivered to your airway through a mask while you sleep. This continuous pressure keeps your airway open.  Nasal expiratory positive airway pressure (EPAP) device. The EPAP device  creates positive air pressure as you exhale. The device consists of single-use valves, which are inserted into each nostril and held in  place by adhesive. The valves create very little resistance when you inhale but create much more resistance when you exhale. That increased resistance creates the positive airway pressure. This positive pressure while you exhale keeps your airway open, making it easier to breath when you inhale again.  Bilevel positive airway pressure (BPAP) device. The BPAP device is used mainly in patients with central sleep apnea. This device is similar to the CPAP device because it also uses an air pump to deliver continuous air pressure through a mask. However, with the BPAP machine, the pressure is set at two different levels. The pressure when you exhale is lower than the pressure when you inhale.  Surgery. Typically, surgery is only done if you cannot comply with less invasive treatments or if the less invasive treatments do not improve your condition. Surgery involves removing excess tissue in your airway to create a wider passage way. Document Released: 08/27/2002 Document Revised: 01/01/2013 Document Reviewed: 01/13/2012 [facility] Patient Information 2015 [place], [place]. This information is not intended to replace advice given to you by your health care provider. Make sure you discuss any questions you have with your health care provider.     Before you even begin to attack a weight-loss plan, it pays to remember this: You are not fat. You have fat. Losing weight isn't about blame or shame; it's simply another achievement to accomplish. Dieting is like any other skill-you have to buckle down and work at it. As long as you act in a smart, reasonable way, you'll ultimately get where you want to be. Here are some weight loss pearls for you.  1. It's Not a Diet. It's a Lifestyle Thinking of a diet as something you're on and suffering through only for the short term doesn't work. To shed weight and keep it off, you need to make permanent changes to the way you eat. It's OK to indulge occasionally, of course, but  if you cut calories temporarily and then revert to your old way of eating, you'll gain back the weight quicker than you can say yo-yo. Use it to lose it. Research shows that one of the best predictors of long-term weight loss is how many pounds you drop in the first month. For that reason, nutritionists often suggest being stricter for the first two weeks of your new eating strategy to build momentum. Cut out added sugar and alcohol and avoid unrefined carbs. After that, figure out how you can reincorporate them in a way that's healthy and maintainable.  2. There's a Right Way to Exercise Working out burns calories and fat and boosts your metabolism by building muscle. But those trying to lose weight are notorious for overestimating the number of calories they burn and underestimating the amount they take in. Unfortunately, your system is biologically programmed to hold on to extra pounds and that means when you start exercising, your body senses the deficit and ramps up its hunger signals. If you're not diligent, you'll eat everything you burn and then some. Use it to lose it. Cardio gets all the exercise glory, but strength and interval training are the real heroes. They help you build lean muscle, which in turn increases your metabolism and calorie-burning ability 3. Don't Overreact to Mild Hunger Some people have a hard time losing weight because of hunger anxiety. To them, being hungry is bad-something to be  avoided at all costs-so they carry snacks with them and eat when they don't need to. Others eat because they're stressed out or bored. While you never want to get to the point of being ravenous (that's when bingeing is likely to happen), a hunger pang, a craving, or the fact that it's 3:00 p.m. should not send you racing for the vending machine or obsessing about the energy bar in your purse. Ideally, you should put off eating until your stomach is growling and it's difficult to concentrate.  Use it  to lose it. When you feel the urge to eat, use the HALT method. Ask yourself, Am I really hungry? Or am I angry or anxious, lonely or bored, or tired? If you're still not certain, try the apple test. If you're truly hungry, an apple should seem delicious; if it doesn't, something else is going on. Or you can try drinking water and making yourself busy, if you are still hungry try a healthy snack.  4. Not All Calories Are Created Equal The mechanics of weight loss are pretty simple: Take in fewer calories than you use for energy. But the kind of food you eat makes all the difference. Processed food that's high in saturated fat and refined starch or sugar can cause inflammation that disrupts the hormone signals that tell your brain you're full. The result: You eat a lot more.  Use it to lose it. Clean up your diet. Swap in whole, unprocessed foods, including vegetables, lean protein, and healthy fats that will fill you up and give you the biggest nutritional bang for your calorie buck. In a few weeks, as your brain starts receiving regular hunger and fullness signals once again, you'll notice that you feel less hungry overall and naturally start cutting back on the amount you eat.  5. Protein, Produce, and Plant-Based Fats Are Your Weight-Loss Trinity Here's why eating the three Ps regularly will help you drop pounds. Protein fills you up. You need it to build lean muscle, which keeps your metabolism humming so that you can torch more fat. People in a weight-loss program who ate double the recommended daily allowance for protein (about 110 grams for a 150-pound woman) lost 70 percent of their weight from fat, while people who ate the RDA lost only about 40 percent, one study found. Produce is packed with filling fiber. "It's very difficult to consume too many calories if you're eating a lot of vegetables. Example: Three cups of broccoli is a lot of food, yet only 93 calories. (Fruit is another story. It can be  easy to overeat and can contain a lot of calories from sugar, so be sure to monitor your intake.) Plant-based fats like olive oil and those in avocados and nuts are healthy and extra satiating.  Use it to lose it. Aim to incorporate each of the three Ps into every meal and snack. People who eat protein throughout the day are able to keep weight off, according to a study in the [place] of Clinical Nutrition. In addition to meat, poultry and seafood, good sources are beans, lentils, eggs, tofu, and yogurt. As for fat, keep portion sizes in check by measuring out salad dressing, oil, and nut butters (shoot for one to two tablespoons). Finally, eat veggies or a little fruit at every meal. People who did that consumed 308 fewer calories but didn't feel any hungrier than when they didn't eat more produce.  7. How You Eat Is As Important As What You  Eat In order for your brain to register that you're full, you need to focus on what you're eating. Sit down whenever you eat, preferably at a table. Turn off the TV or computer, put down your phone, and look at your food. Smell it. Chew slowly, and don't put another bite on your fork until you swallow. When women ate lunch this attentively, they consumed 30 percent less when snacking later than those who listened to an audiobook at lunchtime, according to a study in the [employer]. 8. Weighing Yourself Really Works The scale provides the best evidence about whether your efforts are paying off. Seeing the numbers tick up or down or stagnate is motivation to keep going-or to rethink your approach. A 2015 study at [facility] found that daily weigh-ins helped people lose more weight, keep it off, and maintain that loss, even after two years. Use it to lose it. Step on the scale at the same time every day for the best results. If your weight shoots up several pounds from one weigh-in to the next, don't freak out. Eating a lot of salt the  night before or having your period is the likely culprit. The number should return to normal in a day or two. It's a steady climb that you need to do something about. 9. Too Much Stress and Too Little Sleep Are Your Enemies When you're tired and frazzled, your body cranks up the production of cortisol, the stress hormone that can cause carb cravings. Not getting enough sleep also boosts your levels of ghrelin, a hormone associated with hunger, while suppressing leptin, a hormone that signals fullness and satiety. People on a diet who slept only five and a half hours a night for two weeks lost 55 percent less fat and were hungrier than those who slept eight and a half hours, according to a study in the [place]. Use it to lose it. Prioritize sleep, aiming for seven hours or more a night, which research shows helps lower stress. And make sure you're getting quality zzz's. If a snoring spouse or a fidgety cat wakes you up frequently throughout the night, you may end up getting the equivalent of just four hours of sleep, according to a study from [facility]. Keep pets out of the bedroom, and use a white-noise app to drown out snoring. 10. You Will Hit a plateau-And You Can Bust Through It As you slim down, your body releases much less leptin, the fullness hormone.  If you're not strength training, start right now. Building muscle can raise your metabolism to help you overcome a plateau. To keep your body challenged and burning calories, incorporate new moves and more intense intervals into your workouts or add another sweat session to your weekly routine. Alternatively, cut an extra 100 calories or so a day from your diet. Now that you've lost weight, your body simply doesn't need as much fuel.   Ways to cut 100 calories  1. Eat your eggs with hot sauce OR salsa instead of cheese.  Eggs are great for breakfast, but many people consider eggs and cheese to be BFFs. Instead of  cheese-1 oz. of cheddar has 114 calories-top your eggs with hot sauce, which contains no calories and helps with satiety and metabolism. Salsa is also a great option!!  2. Top your toast, waffles or pancakes with mashed berries instead of jelly or syrup. Half a cup of berries-fresh, frozen or thawed-has about 40 calories, compared  with 2 tbsp. of maple syrup or jelly, which both have about 100 calories. The berries will also give you a good punch of fiber, which helps keep you full and satisfied and won't spike blood sugar quickly like the jelly or syrup. 3. Swap the non-fat latte for black coffee with a splash of half-and-half. Contrary to its name, that non-fat latte has 130 calories and a startling 19g of carbohydrates per 16 oz. serving. Replacing that 'light' drinkable dessert with a black coffee with a splash of half-and-half saves you more than 100 calories per 16 oz. serving. 4. Sprinkle salads with freeze-dried raspberries instead of dried cranberries. If you want a sweet addition to your nutritious salad, stay away from dried cranberries. They have a whopping 130 calories per  cup and 30g carbohydrates. Instead, sprinkle freeze-dried raspberries guilt-free and save more than 100 calories per  cup serving, adding 3g of belly-filling fiber. 5. Go for mustard in place of mayo on your sandwich. Mustard can add really nice flavor to any sandwich, and there are tons of varieties, from spicy to honey. A serving of mayo is 95 calories, versus 10 calories in a serving of mustard. 6. Choose a DIY salad dressing instead of the store-bought kind. Mix Dijon or whole grain mustard with low-fat Kefir or red wine vinegar and garlic. 7. Use hummus as a spread instead of a dip. Use hummus as a spread on a high-fiber cracker or tortilla with a sandwich and save on calories without sacrificing taste. 8. Pick just one salad "accessory." Salad isn't automatically a calorie winner. It's easy to over-accessorize  with toppings. Instead of topping your salad with nuts, avocado and cranberries (all three will clock in at 313 calories), just pick one. The next day, choose a different accessory, which will also keep your salad interesting. You don't wear all your jewelry every day, right? 9. Ditch the white pasta in favor of spaghetti squash. One cup of cooked spaghetti squash has about 40 calories, compared with traditional spaghetti, which comes with more than 200. Spaghetti squash is also nutrient-dense. It's a good source of fiber and Vitamins A and C, and it can be eaten just like you would eat pasta-with a great tomato sauce and [place] meatballs or with pesto, tofu and spinach, for example. 10. Dress up your chili, soups and stews with non-fat [place] yogurt instead of sour cream. Just a 'dollop' of sour cream can set you back 115 calories and a whopping 12g of fat-seven of which are of the artery-clogging variety. Added bonus: [place] yogurt is packed with muscle-building protein, calcium and B Vitamins. 11. Mash cauliflower instead of mashed potatoes. One cup of traditional mashed potatoes-in all their creamy goodness-has more than 200 calories, compared to mashed cauliflower, which you can typically eat for less than 100 calories per 1 cup serving. Cauliflower is a great source of the antioxidant indole-3-carbinol (I3C), which may help reduce the risk of some cancers, like breast cancer. 12. Ditch the ice cream sundae in favor of a [place] yogurt parfait. Instead of a cup of ice cream or fro-yo for dessert, try 1 cup of nonfat Greek yogurt topped with fresh berries and a sprinkle of cacao nibs. Both toppings are packed with antioxidants, which can help reduce cellular inflammation and oxidative damage. And the comparison is a no-brainer: One cup of ice cream has about 275 calories; one cup of frozen yogurt has about 230; and a cup of Greek yogurt has just 130, plus twice the  protein, so you're less likely to return to  the freezer for a second helping. 13. Put olive oil in a spray container instead of using it directly from the bottle. Each tablespoon of olive oil is 120 calories and 15g of fat. Use a mister instead of pouring it straight into the pan or onto a salad. This allows for portion control and will save you more than 100 calories. 14. When baking, substitute canned pumpkin for butter or oil. Canned pumpkin-not pumpkin pie mix-is loaded with Vitamin A, which is important for skin and eye health, as well as immunity. And the comparisons are pretty crazy:  cup of canned pumpkin has about 40 calories, compared to butter or oil, which has more than 800 calories. Yes, 800 calories. Applesauce and mashed banana can also serve as good substitutions for butter or oil, usually in a 1:1 ratio. 15. Top casseroles with high-fiber cereal instead of breadcrumbs. Breadcrumbs are typically made with white bread, while breakfast cereals contain 5-9g of fiber per serving. Not only will you save more than 150 calories per  cup serving, the swap will also keep you more full and you'll get a metabolism boost from the added fiber. 16. Snack on pistachios instead of macadamia nuts. Believe it or not, you get the same amount of calories from 35 pistachios (100 calories) as you would from only five macadamia nuts. 17. Chow down on kale chips rather than potato chips. This is my favorite 'don't knock it 'till you try it' swap. Kale chips are so easy to make at home, and you can spice them up with a little grated parmesan or chili powder. Plus, they're a mere fraction of the calories of potato chips, but with the same crunch factor we crave so often. 18. Add seltzer and some fruit slices to your cocktail instead of soda or fruit juice. One cup of soda or fruit juice can pack on as much as 140 calories. Instead, use seltzer and fruit slices. The fruit provides valuable phytochemicals, such as flavonoids and anthocyanins, which help to  combat cancer and stave off the aging process.  Varicose Veins Varicose veins are veins that have become enlarged and twisted. CAUSES This condition is the result of valves in the veins not working properly. Valves in the veins help return blood from the leg to the heart. When your calf muscles squeeze, the blood moves up your leg then the valves close and this continues until the blood gets back to your heart.  If these valves are damaged, blood flows backwards and backs up into the veins in the leg near the skin OR if your are sitting/standing for a long time without using your calf muscles the blood will back up into the veins in your legs. This causes the veins to become larger. People who are on their feet a lot, sit a lot without walking (like on a plane, at a desk, or in a car), who are pregnant, or who are overweight are more likely to develop varicose veins. SYMPTOMS   Bulging, twisted-appearing, bluish veins, most commonly found on the legs.  Leg pain or a feeling of heaviness. These symptoms may be worse at the end of the day.  Leg swelling.  Skin color changes. DIAGNOSIS  Varicose veins can usually be diagnosed with an exam of your legs by your caregiver. He or she may recommend an ultrasound of your leg veins. TREATMENT  Most varicose veins can be treated at home.However, other treatments are  available for people who have persistent symptoms or who want to treat the cosmetic appearance of the varicose veins. But this is only cosmetic and they will return if not properly treated. These include:  Laser treatment of very small varicose veins.  Medicine that is shot (injected) into the vein. This medicine hardens the walls of the vein and closes off the vein. This treatment is called sclerotherapy. Afterwards, you may need to wear clothing or bandages that apply pressure.  Surgery. HOME CARE INSTRUCTIONS   Do not stand or sit in one position for long periods of time. Do not sit  with your legs crossed. Rest with your legs raised during the day.  Your legs have to be higher than your heart so that gravity will force the valves to open, so please really elevate your legs.   Wear elastic stockings or support hose. Do not wear other tight, encircling garments around the legs, pelvis, or waist.  [employer]  has a wide variety of well priced compression stockings. [address] [place], [place] [address] [phone]  OR THERE ARE COPPER INFUSED COMPRESSION SOCKS AT [facility] OR [facility]  Walk as much as possible to increase blood flow.  Raise the foot of your bed at night with 2-inch blocks.  If you get a cut in the skin over the vein and the vein bleeds, lie down with your leg raised and press on it with a clean cloth until the bleeding stops. Then place a bandage (dressing) on the cut. See your caregiver if it continues to bleed or needs stitches. SEEK MEDICAL CARE IF:   The skin around your ankle starts to break down.  You have pain, redness, tenderness, or hard swelling developing in your leg over a vein.  You are uncomfortable due to leg pain. Document Released: 06/16/2005 Document Revised: 11/29/2011 Document Reviewed: 11/02/2010 [facility] Patient Information 2014 [place], [place].

## 2015-01-16 LAB — BASIC METABOLIC PANEL WITH GFR
BUN: 13 mg/dL (ref 6–23)
CO2: 23 mEq/L (ref 19–32)
CREATININE: 1.06 mg/dL (ref 0.50–1.35)
Calcium: 9.1 mg/dL (ref 8.4–10.5)
Chloride: 104 mEq/L (ref 96–112)
GFR, EST NON AFRICAN AMERICAN: 83 mL/min
GFR, Est African American: >89 mL/min
GLUCOSE: 146 mg/dL — AB (ref 70–99)
Potassium: 4.3 mEq/L (ref 3.5–5.3)
Sodium: 139 mEq/L (ref 135–145)

## 2015-01-16 LAB — HEPATIC FUNCTION PANEL
ALT: 27 U/L (ref 0–53)
AST: 17 U/L (ref 0–37)
Albumin: 4.3 g/dL (ref 3.5–5.2)
Alkaline Phosphatase: 47 U/L (ref 39–117)
BILIRUBIN DIRECT: 0.1 mg/dL (ref 0.0–0.3)
BILIRUBIN TOTAL: 0.5 mg/dL (ref 0.2–1.2)
Indirect Bilirubin: 0.4 mg/dL (ref 0.2–1.2)
Total Protein: 6.6 g/dL (ref 6.0–8.3)

## 2015-01-16 LAB — HEMOGLOBIN A1C
Hgb A1c MFr Bld: 6 % — ABNORMAL HIGH (ref <5.7)
Mean Plasma Glucose: 126 mg/dL — ABNORMAL HIGH (ref <117)

## 2015-01-16 LAB — LIPID PANEL
CHOL/HDL RATIO: 7.1 ratio
CHOLESTEROL: 275 mg/dL — AB (ref 0–200)
HDL: 39 mg/dL — ABNORMAL LOW (ref >=40)
LDL Cholesterol: 189 mg/dL — ABNORMAL HIGH (ref 0–99)
Triglycerides: 236 mg/dL — ABNORMAL HIGH (ref <150)
VLDL: 47 mg/dL — ABNORMAL HIGH (ref 0–40)

## 2015-01-16 LAB — TSH: TSH: 1.541 u[IU]/mL (ref 0.350–4.500)

## 2015-01-16 LAB — MAGNESIUM: MAGNESIUM: 1.8 mg/dL (ref 1.5–2.5)

## 2015-01-16 LAB — VITAMIN D 25 HYDROXY (VIT D DEFICIENCY, FRACTURES): Vit D, 25-Hydroxy: 29 ng/mL — ABNORMAL LOW (ref 30–100)

## 2015-01-16 LAB — INSULIN, FASTING: Insulin fasting, serum: 79.2 u[IU]/mL — ABNORMAL HIGH (ref 2.0–19.6)

## 2015-01-17 ENCOUNTER — Other Ambulatory Visit: Payer: Self-pay | Admitting: Physician Assistant

## 2015-01-17 MED ORDER — VITAMIN D (ERGOCALCIFEROL) 1.25 MG (50000 UNIT) PO CAPS: 50000.0000 [IU] | ORAL_CAPSULE | ORAL | Status: AC

## 2015-04-03 ENCOUNTER — Other Ambulatory Visit: Payer: Self-pay | Admitting: Internal Medicine

## 2015-04-24 ENCOUNTER — Ambulatory Visit: Payer: Self-pay | Admitting: Internal Medicine

## 2015-10-22 ENCOUNTER — Encounter: Payer: Self-pay | Admitting: Internal Medicine
